# Patient Record
Sex: Male | Born: 1985 | Race: Black or African American | Hispanic: No | Marital: Married | Smoking: Never smoker
Health system: Southern US, Community
[De-identification: ages and names within clinical notes are randomized; demographics above are authoritative.]

---
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
## 2007-12-16 HISTORY — PX: ANTERIOR CRUCIATE LIGAMENT REPAIR: SHX115

## 2008-05-24 ENCOUNTER — Ambulatory Visit (HOSPITAL_BASED_OUTPATIENT_CLINIC_OR_DEPARTMENT_OTHER): Admission: RE | Admit: 2008-05-24 | Discharge: 2008-05-24 | Payer: Self-pay | Admitting: Orthopedic Surgery

## 2008-12-15 HISTORY — PX: ANTERIOR CRUCIATE LIGAMENT REPAIR: SHX115

## 2008-12-15 HISTORY — PX: ACHILLES TENDON REPAIR: SUR1153

## 2009-01-06 ENCOUNTER — Emergency Department (HOSPITAL_COMMUNITY): Admission: EM | Admit: 2009-01-06 | Discharge: 2009-01-06 | Payer: Self-pay | Admitting: Emergency Medicine

## 2011-01-05 ENCOUNTER — Encounter: Payer: Self-pay | Admitting: Family Medicine

## 2011-01-13 ENCOUNTER — Ambulatory Visit
Admission: RE | Admit: 2011-01-13 | Discharge: 2011-01-13 | Payer: Self-pay | Source: Home / Self Care | Attending: Family Medicine | Admitting: Family Medicine

## 2011-01-13 DIAGNOSIS — M6740 Ganglion, unspecified site: Secondary | ICD-10-CM | POA: Insufficient documentation

## 2011-01-20 ENCOUNTER — Encounter: Payer: Self-pay | Admitting: Family Medicine

## 2011-01-20 ENCOUNTER — Encounter (INDEPENDENT_AMBULATORY_CARE_PROVIDER_SITE_OTHER): Payer: Self-pay | Admitting: *Deleted

## 2011-01-20 ENCOUNTER — Other Ambulatory Visit (INDEPENDENT_AMBULATORY_CARE_PROVIDER_SITE_OTHER): Payer: 59

## 2011-01-20 ENCOUNTER — Other Ambulatory Visit: Payer: Self-pay | Admitting: Family Medicine

## 2011-01-20 DIAGNOSIS — Z0000 Encounter for general adult medical examination without abnormal findings: Secondary | ICD-10-CM

## 2011-01-20 DIAGNOSIS — R740 Nonspecific elevation of levels of transaminase and lactic acid dehydrogenase [LDH]: Secondary | ICD-10-CM

## 2011-01-20 DIAGNOSIS — Z113 Encounter for screening for infections with a predominantly sexual mode of transmission: Secondary | ICD-10-CM

## 2011-01-20 DIAGNOSIS — R7401 Elevation of levels of liver transaminase levels: Secondary | ICD-10-CM | POA: Insufficient documentation

## 2011-01-20 LAB — BASIC METABOLIC PANEL
BUN: 10 mg/dL (ref 6–23)
Calcium: 9.3 mg/dL (ref 8.4–10.5)
GFR: 93.62 mL/min (ref >60.00)
Glucose, Bld: 81 mg/dL (ref 70–99)
Potassium: 4.9 mEq/L (ref 3.5–5.1)
Sodium: 141 mEq/L (ref 135–145)

## 2011-01-20 LAB — HEPATIC FUNCTION PANEL
ALT: 99 U/L — ABNORMAL HIGH (ref 0–53)
AST: 73 U/L — ABNORMAL HIGH (ref 0–37)
Albumin: 3.7 g/dL (ref 3.5–5.2)
Alkaline Phosphatase: 56 U/L (ref 39–117)
Bilirubin, Direct: 0.2 mg/dL (ref 0.0–0.3)
Total Protein: 6.9 g/dL (ref 6.0–8.3)

## 2011-01-21 LAB — CONVERTED CEMR LAB

## 2011-01-22 NOTE — Assessment & Plan Note (Signed)
Summary: NEW PT TO EST/CPX/CLE   Vital Signs:  Patient profile:   25 year old male Height:      74 inches Weight:      218.75 pounds BMI:     28.19 Temp:     98.5 degrees F oral Pulse rate:   68 / minute Pulse rhythm:   regular BP sitting:   132 / 82  (left arm) Cuff size:   large  Vitals Entered By: Selena Batten Dance CMA Duncan Dull) (January 13, 2011 10:28 AM) CC: New patient to establish care   History of Present Illness: CC: new patient, establish  had CPE 2009 ([place]), thinks all normal.  had HIV test done then.  describes ganglion cyst that comes intermittently after sprained wrist 2010.  asks what can be done about that.    Prevention: flu shot - never had tetanus shot - 2006 done (thinks at [facility])  Current Medications (verified): 1)  Creatine 750 Mg Caps (Nutritional Supplements) .... One Daily 2)  Multivitamins  Tabs (Multiple Vitamin) .... One Daily  Allergies (verified): No Known Drug Allergies  Past History:  Past Medical History: none  ortho - [facility]  Past Surgical History: L ACL 2009, 2011 ([place]) R Achilles 2010 ([place])  Family History: F: HTN M: BRCA Uncle: colon CA PGF: intestinal CA, CAD/MI MGM: CVA  No DM  Social History: No smoking, social EtOH, no rec drugs caffeine: rarely Lives with cousin (roommate) Occupation: Merchandiser, retail at [employer] Edu: Hotel manager at [employer]  Review of Systems  The patient denies anorexia, fever, weight loss, weight gain, vision loss, decreased hearing, hoarseness, chest pain, syncope, dyspnea on exertion, peripheral edema, prolonged cough, headaches, hemoptysis, abdominal pain, melena, hematochezia, severe indigestion/heartburn, hematuria, incontinence, depression, and testicular masses.    Physical Exam  General:  Well-developed,well-nourished,in no acute distress; alert,appropriate and cooperative throughout examination Head:  Normocephalic and atraumatic without obvious abnormalities.  Eyes:   No corneal or conjunctival inflammation noted. EOMI. Perrla.  Ears:  TMs clear bilaterally Nose:  nares clear bilaterally Mouth:  MMM, no pharyngeal erythema/edema Neck:  No deformities, masses, or tenderness noted.  no LAD Lungs:  Normal respiratory effort, chest expands symmetrically. Lungs are clear to auscultation, no crackles or wheezes. Heart:  Normal rate and regular rhythm. S1 and S2 normal without gallop, murmur, click, rub or other extra sounds. Abdomen:  Bowel sounds positive,abdomen soft and non-tender without masses, organomegaly or hernias noted. Msk:  No deformity or scoliosis noted of thoracic or lumbar spine.   Pulses:  2+ rad pulses, brisk cap refill Extremities:  no pedal edema  Neurologic:  CN grossly intact, station and gait intact Skin:  Intact without suspicious lesions or rashes Psych:  full affect, pleasant and cooperative   Impression & Recommendations:  Problem # 1:  HEALTH MAINTENANCE EXAM (ICD-V70.0) Reviewed preventive care protocols, scheduled due services, and updated immunizations.  healthy 25 yo, return as needed or in 1-2 years for next CPE.  Problem # 2:  SCREENING EXAMINATION FOR VENEREAL DISEASE (ICD-V74.5) HIV/RPR next blood draw per patient request.  Problem # 3:  GANGLION CYST, WRIST, LEFT (ICD-727.41) none currently.  discussed different treatment options.  advised to return if bothersome.  Complete Medication List: 1)  Creatine 750 Mg Caps (Nutritional supplements) .... One daily 2)  Multivitamins Tabs (Multiple vitamin) .... One daily  Patient Instructions: 1)  Good to meet you today. 2)  return in next few weeks for fasting bloodwork [CMP, FLP, HIV, RPR V70.0, V74.5] 3)  Call clinic with questions. 4)  Come back in 1-2 years for next complete physical or as needed.   Orders Added: 1)  New Patient 18-39 years [[id number]]    Prior Medications: Current Allergies (reviewed today): No known allergies

## 2011-01-30 ENCOUNTER — Telehealth: Payer: Self-pay | Admitting: Family Medicine

## 2011-01-30 LAB — CONVERTED CEMR LAB

## 2011-02-05 NOTE — Progress Notes (Signed)
Summary: blood work thrown away  Phone Note From Other Clinic   Call For: Dr Sharen Hones Summary of Call: Sample was tossed before they pulled it for the Hepatitis Panel, Patient will need to be redrawn if you still want this test.  Initial call taken by: Mills Koller,  January 30, 2011 4:27 PM  Follow-up for Phone Call        please call patient and apologize for taking so long to get back to him - was awaiting viral hepatitis panel which turns out was thrown away accidentally by lab.  give other labwork results, and offer to redraw (no charge venipuncture) hepatitis panel. Follow-up by: Eustaquio Boyden  MD,  January 30, 2011 4:36 PM  Additional Follow-up for Phone Call Additional follow up Details #1::        Message left for patient to return my call. Kim Dance CMA Duncan Dull)  January 31, 2011 8:40 AM   Patient notified and will return for hep. panel. Appt scheduled to coincide with repeat LFT's in March.  Additional Follow-up by: Janee Morn CMA Duncan Dull),  January 31, 2011 12:45 PM

## 2011-02-24 ENCOUNTER — Other Ambulatory Visit: Payer: Self-pay | Admitting: Family Medicine

## 2011-02-24 ENCOUNTER — Encounter: Payer: Self-pay | Admitting: Family Medicine

## 2011-02-24 ENCOUNTER — Encounter: Payer: Self-pay | Admitting: *Deleted

## 2011-02-24 ENCOUNTER — Other Ambulatory Visit (INDEPENDENT_AMBULATORY_CARE_PROVIDER_SITE_OTHER): Payer: 59

## 2011-02-24 DIAGNOSIS — R7401 Elevation of levels of liver transaminase levels: Secondary | ICD-10-CM

## 2011-02-24 DIAGNOSIS — R7402 Elevation of levels of lactic acid dehydrogenase (LDH): Secondary | ICD-10-CM

## 2011-02-24 DIAGNOSIS — R740 Nonspecific elevation of levels of transaminase and lactic acid dehydrogenase [LDH]: Secondary | ICD-10-CM

## 2011-02-24 LAB — HEPATIC FUNCTION PANEL
ALT: 134 U/L — ABNORMAL HIGH (ref 0–53)
AST: 76 U/L — ABNORMAL HIGH (ref 0–37)
Albumin: 3.9 g/dL (ref 3.5–5.2)
Alkaline Phosphatase: 47 U/L (ref 39–117)
Total Protein: 6.7 g/dL (ref 6.0–8.3)

## 2011-02-27 ENCOUNTER — Encounter: Payer: Self-pay | Admitting: Family Medicine

## 2011-04-29 NOTE — Op Note (Signed)
NAMEJEZIAH, KRETSCHMER NO.:  [id number]   MEDICAL RECORD NO.:  [id number]          PATIENT TYPE:  AMB   LOCATION:  DSC                          FACILITY:  [facility]   PHYSICIAN:  Mila Homer. Sherlean Foot, M.D. DATE OF BIRTH:  08/04/1986   DATE OF PROCEDURE:  05/24/2008  DATE OF DISCHARGE:                               OPERATIVE REPORT   SURGEON:  Mila Homer. Sherlean Foot, MD.   Jani FilesSkip Mayer, PA.   ANESTHESIA:  General.   PREOPERATIVE DIAGNOSES:  Left knee anterior cruciate ligament tear and  medial meniscus tear.   POSTOPERATIVE DIAGNOSES:  1. Left knee anterior cruciate ligament tear and medial meniscus tear.  2. Osteoarthritis.   INDICATIONS FOR PROCEDURE:  The patient is 25 year old white male who is  6 years status post ACL tear and now with medial meniscus tearing and  mechanical symptoms.  Informed consent was obtained for ACL allograft.   DESCRIPTION OF PROCEDURE:  The patient was laid supine, administered  general anesthesia.  Left leg prepped and draped in usual sterile  fashion.  Inferolateral and femoral portals were created with a #11  blade, blunt trocar, and cannula.  Diagnostic arthroscopy revealed no  chondromalacia in the patellofemoral joint, some in the medial  compartment, and actually grade 2 and grade 3 in the lateral femoral  condyle.  The medial meniscus had a large tear.  It was not repairable,  but did involve about 75-80% of the entirety of the meniscus and this  was debrided.  Then, the ACL was actually not even present.  In any way,  we performed an aggressive notchplasty with a 4-mm cylindrical bur.  The  lateral part was basically normal except for the osteoarthritis.  The  meniscus had a few small degenerative tears which were debrided as well.  I then completed the notchplasty.  We had prepared the allograft on the  back table.  We used Arthrex system to drill 10-mm bone plugs in the  tibia and femur and placed 7 x 25-mm titanium  interference screws  anterior to each.  This completely eliminated the joint walk and there  was no impingement.  The graft was nice and tight.  Full range of  motion, then irrigated the knee, and closed with some buried 2-0s in the  tibial tunnel site, then 4-0 interrupted nylons throughout.   COMPLICATIONS:  None.   DRAINS:  None.   DRESSING:  Xeroform dressing, sponges, sterile Webril, and Ace wrap.          ______________________________  Mila Homer. Sherlean Foot, M.D.    SDL/MEDQ  D:  05/24/2008  T:  05/24/2008  Job:  [id number]

## 2011-05-26 ENCOUNTER — Other Ambulatory Visit (INDEPENDENT_AMBULATORY_CARE_PROVIDER_SITE_OTHER): Payer: 59 | Admitting: Family Medicine

## 2011-05-26 DIAGNOSIS — R7401 Elevation of levels of liver transaminase levels: Secondary | ICD-10-CM

## 2011-05-26 LAB — HEPATIC FUNCTION PANEL
ALT: 102 U/L — ABNORMAL HIGH (ref 0–53)
Alkaline Phosphatase: 51 U/L (ref 39–117)
Bilirubin, Direct: 0.1 mg/dL (ref 0.0–0.3)
Total Bilirubin: 0.7 mg/dL (ref 0.3–1.2)
Total Protein: 7.4 g/dL (ref 6.0–8.3)

## 2011-05-26 LAB — IBC PANEL: Transferrin: 267.6 mg/dL (ref 212.0–360.0)

## 2011-05-27 ENCOUNTER — Telehealth: Payer: Self-pay | Admitting: Family Medicine

## 2011-05-27 DIAGNOSIS — R7401 Elevation of levels of liver transaminase levels: Secondary | ICD-10-CM

## 2011-05-27 NOTE — Telephone Encounter (Signed)
Please notify liver function remaining somewhat elevated.   If still taking creatine may recommend trial off this to see if liver function normalizes. Ask if he's taking any other over the counters, vitamins or supplements, ask about tylenol use, alcohol use. Ask if pt was fasting by chance when he came in yesterday for blood draw.  If he was fasting yesterday, I'd like to add FLP to blood in lab.  If not, would like him to come in at his convenience fasting for FLP, no charge to him as this was ordered 01/2011 and not done.  Please let Jorge Simpson or Jorge Simpson know as well. I would like to set him up with liver ultrasound to further eval.  Placed order in chart and routed to [facility] as well.

## 2011-05-27 NOTE — Telephone Encounter (Signed)
Message left for patient to return my call.  

## 2011-05-30 NOTE — Telephone Encounter (Signed)
ABD/US set up for 06/03/2011 at [facility]  At 8am, patient notified.

## 2011-05-30 NOTE — Telephone Encounter (Signed)
Spoke with patient and scheduled lab appt on Monday 06/02/11

## 2011-05-30 NOTE — Telephone Encounter (Addendum)
I would like to set patient up for FLP at his convenience, fasting. No charge for drawing fee as this was ordered 01/2011 and not done. plz let terry or tasha know. Thanks.

## 2011-05-30 NOTE — Telephone Encounter (Signed)
Spoke with patient about results, he has stopped taking the creatine, he does takes vitamins "vita-pak" and does drink alcohol, he had about 1-2 mixed drinks around the time labwork was done, pt was fasting when labs were done. Transferred call to [facility] to set up ultrasound appt.

## 2011-06-02 ENCOUNTER — Other Ambulatory Visit (INDEPENDENT_AMBULATORY_CARE_PROVIDER_SITE_OTHER): Payer: 59 | Admitting: Family Medicine

## 2011-06-02 DIAGNOSIS — R7401 Elevation of levels of liver transaminase levels: Secondary | ICD-10-CM

## 2011-06-02 LAB — LIPID PANEL
Cholesterol: 274 mg/dL — ABNORMAL HIGH (ref 0–200)
HDL: 35 mg/dL — ABNORMAL LOW (ref >39.00)
Total CHOL/HDL Ratio: 8
Triglycerides: 116 mg/dL (ref 0.0–149.0)
VLDL: 23.2 mg/dL (ref 0.0–40.0)

## 2011-06-03 ENCOUNTER — Ambulatory Visit
Admission: RE | Admit: 2011-06-03 | Discharge: 2011-06-03 | Disposition: A | Discharge status: Home / Self Care | Payer: 59 | Source: Ambulatory Visit | Attending: Family Medicine | Admitting: Family Medicine

## 2011-06-03 DIAGNOSIS — R7401 Elevation of levels of liver transaminase levels: Secondary | ICD-10-CM

## 2012-10-07 ENCOUNTER — Ambulatory Visit (INDEPENDENT_AMBULATORY_CARE_PROVIDER_SITE_OTHER): Payer: 59 | Admitting: Emergency Medicine

## 2012-10-07 VITALS — BP 144/84 | HR 61 | Temp 97.8°F | Resp 17 | Ht 72.0 in | Wt 202.0 lb

## 2012-10-07 DIAGNOSIS — H9190 Unspecified hearing loss, unspecified ear: Secondary | ICD-10-CM

## 2012-10-07 NOTE — Progress Notes (Signed)
[facility] [address] [place] [address] [phone]  Date:  10/07/2012   Name:  Jorge Simpson   DOB:  [birth year]   MRN:  [id number]  PCP:  Eustaquio Boyden, MD    Chief Complaint: Cerumen Impaction   History of Present Illness:  [name] is a 26 y.o. very pleasant male patient who presents with the following:  Concerned that he has cerumen impaction in his ears as his hearing is diminished left worse than right.  Denies cough or coryza, fever or chills, sore throat or foreign body in the ears.  No exposure to loud noises  Patient Active Problem List  Diagnosis  . GANGLION CYST, WRIST, LEFT  . TRANSAMINASES, SERUM, ELEVATED    No past medical history on file.  Past Surgical History  Procedure Date  . Joint replacement     History  Substance Use Topics  . Smoking status: Never Smoker   . Smokeless tobacco: Not on file  . Alcohol Use: Not on file    No family history on file.  No Known Allergies  Medication list has been reviewed and updated.  No current outpatient prescriptions on file prior to visit.    Review of Systems:  As per HPI, otherwise negative.    Physical Examination: Filed Vitals:   10/07/12 1155  BP: 144/84  Pulse: 61  Temp: 97.8 F (36.6 C)  Resp: 17   Filed Vitals:   10/07/12 1155  Height: 6' (1.829 m)  Weight: 202 lb (91.627 kg)   Body mass index is 27.40 kg/(m^2). Ideal Body Weight: Weight in (lb) to have BMI = 25: 183.9   GEN: WDWN, NAD, Non-toxic, A & O x 3 HEENT: Atraumatic, Normocephalic. Neck supple. No masses, No LAD. Ears and Nose: No external deformity.  TM negative external ear canal clear CV: RRR, No M/G/R. No JVD. No thrill. No extra heart sounds. PULM: CTA B, no wheezes, crackles, rhonchi. No retractions. No resp. distress. No accessory muscle use. ABD: S, NT, ND, +BS. No rebound. No HSM. EXTR: No c/c/e NEURO Normal gait.  PSYCH: Normally interactive. Conversant. Not depressed  or anxious appearing.  Calm demeanor.    Assessment and Plan: Mild high frequency hearing loss left ear. Follow up as needed  Carmelina Dane, MD  I have reviewed and agree with documentation. Robert P. Merla Riches, M.D.

## 2012-10-11 ENCOUNTER — Ambulatory Visit (INDEPENDENT_AMBULATORY_CARE_PROVIDER_SITE_OTHER): Payer: 59 | Admitting: Family Medicine

## 2012-10-11 ENCOUNTER — Encounter: Payer: Self-pay | Admitting: Family Medicine

## 2012-10-11 VITALS — BP 118/78 | HR 64 | Temp 98.0°F | Wt 206.8 lb

## 2012-10-11 DIAGNOSIS — H6590 Unspecified nonsuppurative otitis media, unspecified ear: Secondary | ICD-10-CM

## 2012-10-11 DIAGNOSIS — H6592 Unspecified nonsuppurative otitis media, left ear: Secondary | ICD-10-CM

## 2012-10-11 MED ORDER — AMOXICILLIN 875 MG PO TABS: 875.0000 mg | ORAL_TABLET | Freq: Two times a day (BID) | ORAL | Status: DC

## 2012-10-11 NOTE — Progress Notes (Signed)
  Subjective:    Patient ID: Jorge Simpson, male    DOB: 06-03-1986, 26 y.o.   MRN: [id number]  HPI CC: f/u [facility] eval for hearing loss  Seen at [facility] 10/07/2012 with concern for hearing loss - evaluation negative for cerumen impaction.  Noted L>R hearing loss and pressure that started Thursday morning (5d ago).  Used qtip on Wednesday.  Denies recent viral sxs, denies recent exposure to loud noises.  No recent tinnitus, drainage from ear, recent ear infection, fevers/chills, swimming.  Supervisor at [employer].  No recent air travel.  No recent diving.  No past medical history on file.   Review of Systems Per HPI    Objective:   Physical Exam  Nursing note and vitals reviewed. Constitutional: He appears well-developed and well-nourished. No distress.  HENT:  Head: Normocephalic and atraumatic.  Right Ear: Hearing, tympanic membrane, external ear and ear canal normal.  Left Ear: Hearing, external ear and ear canal normal. No mastoid tenderness. Tympanic membrane is erythematous and retracted. Tympanic membrane mobility is abnormal. A middle ear effusion is present. No hemotympanum.  Nose: Nose normal. No mucosal edema or rhinorrhea.  Mouth/Throat: Uvula is midline, oropharynx is clear and moist and mucous membranes are normal. No oropharyngeal exudate, posterior oropharyngeal edema, posterior oropharyngeal erythema or tonsillar abscesses.       L TM with loss of landmarks and light reflex. Green fluid accumulation behind TM. nontender to palpation.  Eyes: Conjunctivae normal and EOM are normal. Pupils are equal, round, and reactive to light. No scleral icterus.  Neck: Normal range of motion. Neck supple.  Lymphadenopathy:    He has no cervical adenopathy.  Neurological:       Normal rhinne and weber       Assessment & Plan:

## 2012-10-11 NOTE — Assessment & Plan Note (Addendum)
Treat supportively as per instructions. Will also treat with course of amoxicillin to treat any possible ear infection. Given complete loss of anatomy, unable to fully r/o perf.  rtc 3-4 wks for recheck.

## 2012-10-11 NOTE — Patient Instructions (Addendum)
You do have middle ear effusion. We will treat with antibiotic to treat any possible simmering ear infection. May use nasal saline as well as pseudophed (Sudafed) as needed as decongestant for symptomatic relief (you will likely have to sign for this at the pharmacy). Hearing screen today. Return in 3-4 weeks for recheck.  If persistent trouble, we will refer you to ENT.  Otitis Media with Effusion Otitis media with effusion is the presence of fluid in the middle ear. This is a common problem that often follows ear infections. It may be present for weeks or longer after the infection. Unlike an acute ear infection, otits media with effusion refers only to fluid behind the ear drum and not infection. Children with repeated ear and sinus infections and allergy problems are the most likely to get otitis media with effusion. CAUSES  The most frequent cause of the fluid buildup is dysfunction of the eustacian tubes. These are the tubes that drain fluid in the ears to the throat. SYMPTOMS   The main symptom of this condition is hearing loss. As a result, you or your child may:  Listen to the TV at a loud volume.  Not respond to questions.  Ask "what" often when spoken to.  There may be a sensation of fullness or pressure but usually not pain. DIAGNOSIS   Your caregiver will diagnose this condition by examining you or your child's ears.  Your caregiver may test the pressure in you or your child's ear with a tympanometer.  A hearing test may be conducted if the problem persists.  A caregiver will want to re-evaluate the condition periodically to see if it improves. TREATMENT   Treatment depends on the duration and the effects of the effusion.  Antibiotics, decongestants, nose drops, and cortisone-type drugs may not be helpful.  Children with persistent ear effusions may have delayed language. Children at risk for developmental delays in hearing, learning, and speech may require referral  to a specialist earlier than children not at risk.  You or your child's caregiver may suggest a referral to an Ear, Nose, and Throat (ENT) surgeon for treatment. The following may help restore normal hearing:  Drainage of fluid.  Placement of ear tubes (tympanostomy tubes).  Removal of adenoids (adenoidectomy). HOME CARE INSTRUCTIONS   Avoid second hand smoke.  Infants who are breast fed are less likely to have this condition.  Avoid feeding infants while laying flat.  Avoid known environmental allergens.  Be sure to see a caregiver or an ENT specialist for follow up.  Avoid people who are sick. SEEK MEDICAL CARE IF:   Hearing is not better in 3 months.  Hearing is worse.  Ear pain.  Drainage from the ear.  Dizziness. Document Released: 01/08/2005 Document Revised: 02/23/2012 Document Reviewed: 04/23/2010 [facility] Patient Information 2013 [place], [place].

## 2012-11-01 ENCOUNTER — Encounter: Payer: Self-pay | Admitting: Family Medicine

## 2012-11-01 ENCOUNTER — Ambulatory Visit (INDEPENDENT_AMBULATORY_CARE_PROVIDER_SITE_OTHER): Payer: 59 | Admitting: Family Medicine

## 2012-11-01 VITALS — BP 130/84 | HR 64 | Temp 98.0°F | Wt 208.2 lb

## 2012-11-01 DIAGNOSIS — H6590 Unspecified nonsuppurative otitis media, unspecified ear: Secondary | ICD-10-CM

## 2012-11-01 DIAGNOSIS — H6592 Unspecified nonsuppurative otitis media, left ear: Secondary | ICD-10-CM

## 2012-11-01 NOTE — Progress Notes (Signed)
  Subjective:    Patient ID: Jorge Simpson, male    DOB: Sep 20, 1986, 26 y.o.   MRN: [id number]  HPI CC: f/u hearing loss  Seen here last month with dx serous otitis media and L hearing loss, treated with amoxicillin course as well as other supportive care.  Overall improving, still slightly muffled.  Denies fevers/chills, ear drainage.  No past medical history on file.   Review of Systems Per HPI    Objective:   Physical Exam  Nursing note and vitals reviewed. Constitutional: He appears well-developed and well-nourished. No distress.  HENT:  Head: Normocephalic and atraumatic.  Right Ear: Hearing, tympanic membrane, external ear and ear canal normal.  Left Ear: Hearing, tympanic membrane, external ear and ear canal normal.  Nose: Nose normal.  Mouth/Throat: Oropharynx is clear and moist. No oropharyngeal exudate.       Landmarks visualized and back to normal bilaterally. L TM - pearly grey, good light reflex.  Eyes: Conjunctivae normal and EOM are normal. Pupils are equal, round, and reactive to light.  Lymphadenopathy:    He has no cervical adenopathy.       Assessment & Plan:

## 2012-11-01 NOTE — Assessment & Plan Note (Signed)
Resolving well.  Improved hearing screen. Discussed anticipated resolution.  RTC PRN.

## 2012-11-01 NOTE — Patient Instructions (Addendum)
Ear getting back to normal. Should continue to resolve with time. Call us with questions .

## 2012-11-23 ENCOUNTER — Ambulatory Visit (INDEPENDENT_AMBULATORY_CARE_PROVIDER_SITE_OTHER): Payer: 59 | Admitting: Family Medicine

## 2012-11-23 ENCOUNTER — Encounter: Payer: Self-pay | Admitting: Family Medicine

## 2012-11-23 VITALS — BP 112/80 | HR 88 | Temp 99.5°F | Wt 199.2 lb

## 2012-11-23 DIAGNOSIS — A088 Other specified intestinal infections: Secondary | ICD-10-CM

## 2012-11-23 DIAGNOSIS — A084 Viral intestinal infection, unspecified: Secondary | ICD-10-CM | POA: Insufficient documentation

## 2012-11-23 MED ORDER — PROMETHAZINE HCL 25 MG PO TABS: 25.0000 mg | ORAL_TABLET | Freq: Three times a day (TID) | ORAL | Status: DC | PRN

## 2012-11-23 NOTE — Assessment & Plan Note (Signed)
Vs food poisoning. Supportive care discussed.  See pt instructions. Treat nausea with phenergan. Discussed importance of hydration status. Out of work note for 2 days.

## 2012-11-23 NOTE — Patient Instructions (Signed)
This is either viral gastroenteritis or food poisoning. Push small sips of water, gatorade, ginger ale throughout the day to avoid dehydration. May use phenergan as needed for nausea - may make you sleepy. Out of work today and tomorrow.  Should be able to return on Thursday. Let us know if fever >101 or worsening abdominal pain or any bleeding.  Viral Gastroenteritis Viral gastroenteritis is also known as stomach flu. This condition affects the stomach and intestinal tract. It can cause sudden diarrhea and vomiting. The illness typically lasts 3 to 8 days. Most people develop an immune response that eventually gets rid of the virus. While this natural response develops, the virus can make you quite ill. CAUSES  Many different viruses can cause gastroenteritis, such as rotavirus or noroviruses. You can catch one of these viruses by consuming contaminated food or water. You may also catch a virus by sharing utensils or other personal items with an infected person or by touching a contaminated surface. SYMPTOMS  The most common symptoms are diarrhea and vomiting. These problems can cause a severe loss of body fluids (dehydration) and a body salt (electrolyte) imbalance. Other symptoms may include:  Fever.  Headache.  Fatigue.  Abdominal pain. DIAGNOSIS  Your caregiver can usually diagnose viral gastroenteritis based on your symptoms and a physical exam. A stool sample may also be taken to test for the presence of viruses or other infections. TREATMENT  This illness typically goes away on its own. Treatments are aimed at rehydration. The most serious cases of viral gastroenteritis involve vomiting so severely that you are not able to keep fluids down. In these cases, fluids must be given through an intravenous line (IV). HOME CARE INSTRUCTIONS   Drink enough fluids to keep your urine clear or pale yellow. Drink small amounts of fluids frequently and increase the amounts as tolerated.  Ask  your caregiver for specific rehydration instructions.  Avoid:  Foods high in sugar.  Alcohol.  Carbonated drinks.  Tobacco.  Juice.  Caffeine drinks.  Extremely hot or cold fluids.  Fatty, greasy foods.  Too much intake of anything at one time.  Dairy products until 24 to 48 hours after diarrhea stops.  You may consume probiotics. Probiotics are active cultures of beneficial bacteria. They may lessen the amount and number of diarrheal stools in adults. Probiotics can be found in yogurt with active cultures and in supplements.  Wash your hands well to avoid spreading the virus.  Only take over-the-counter or prescription medicines for pain, discomfort, or fever as directed by your caregiver. Do not give aspirin to children. Antidiarrheal medicines are not recommended.  Ask your caregiver if you should continue to take your regular prescribed and over-the-counter medicines.  Keep all follow-up appointments as directed by your caregiver. SEEK IMMEDIATE MEDICAL CARE IF:   You are unable to keep fluids down.  You do not urinate at least once every 6 to 8 hours.  You develop shortness of breath.  You notice blood in your stool or vomit. This may look like coffee grounds.  You have abdominal pain that increases or is concentrated in one small area (localized).  You have persistent vomiting or diarrhea.  You have a fever.  The patient is a child younger than 3 months, and he or she has a fever.  The patient is a child older than 3 months, and he or she has a fever and persistent symptoms.  The patient is a child older than 3 months, and he  or she has a fever and symptoms suddenly get worse.  The patient is a baby, and he or she has no tears when crying. MAKE SURE YOU:   Understand these instructions.  Will watch your condition.  Will get help right away if you are not doing well or get worse. Document Released: 12/01/2005 Document Revised: 02/23/2012 Document  Reviewed: 09/17/2011 [facility] Patient Information 2013 [place], [place].

## 2012-11-23 NOTE — Progress Notes (Signed)
  Subjective:    Patient ID: Jorge Simpson, male    DOB: 29-Feb-1986, 26 y.o.   MRN: [id number]  HPI CC: emesis  Around 1am this morning awoke with abd pain/pressure, this led to nausea and vomiting - NBNB.   abd pain better but feeling very weak.  At 4am started with some diarrhea.  Trying to drink.  Last emesis was 8am.    No fevers/chills.   No recent travel.  Ate at [employer] last night, restaurant on sanitation list. No sick contacts at home.  Doesn't know how other ppl who ate are doing.  No past medical history on file.   Review of Systems Per HPI    Objective:   Physical Exam  Nursing note and vitals reviewed. Constitutional: He appears well-developed and well-nourished. No distress.  HENT:  Head: Normocephalic and atraumatic.  Mouth/Throat: Oropharynx is clear and moist. No oropharyngeal exudate.  Abdominal: Soft. Bowel sounds are normal. He exhibits no distension and no mass. There is no hepatosplenomegaly. There is no tenderness. There is no rebound, no guarding and no CVA tenderness.  Musculoskeletal: He exhibits no edema.       Assessment & Plan:

## 2012-12-23 IMAGING — US US ABDOMEN COMPLETE
1 series · 14 of 25 positions shown · non-contrast
Comparison: None.

CLINICAL DATA: Persistently elevated LFTs

COMPLETE ABDOMINAL ULTRASOUND

[Series 1: us abdomen complete · 0.28mm/px · 14 of 88 slices shown]
[im 1/88]
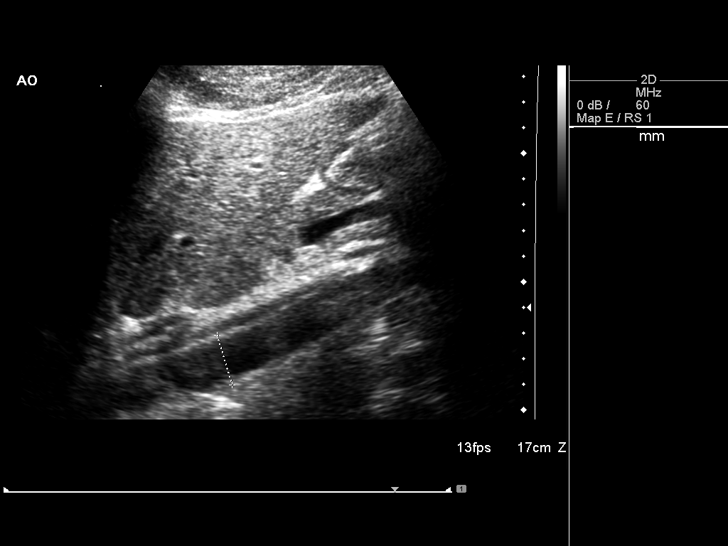
[im 8/88]
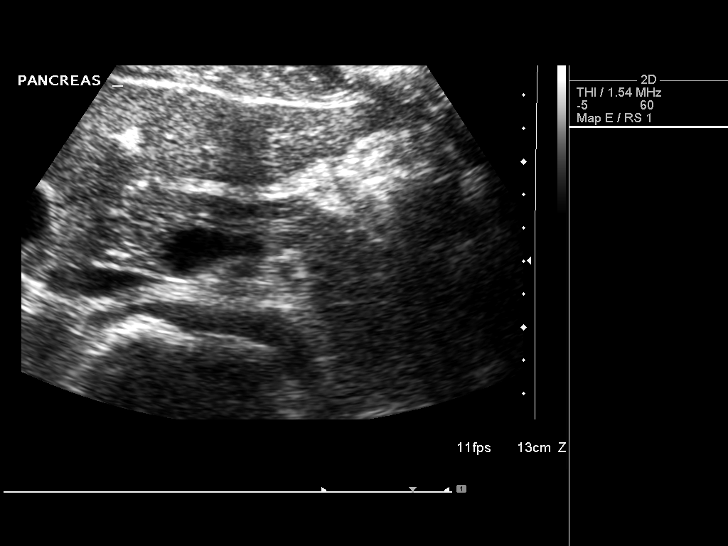
[im 15/88]
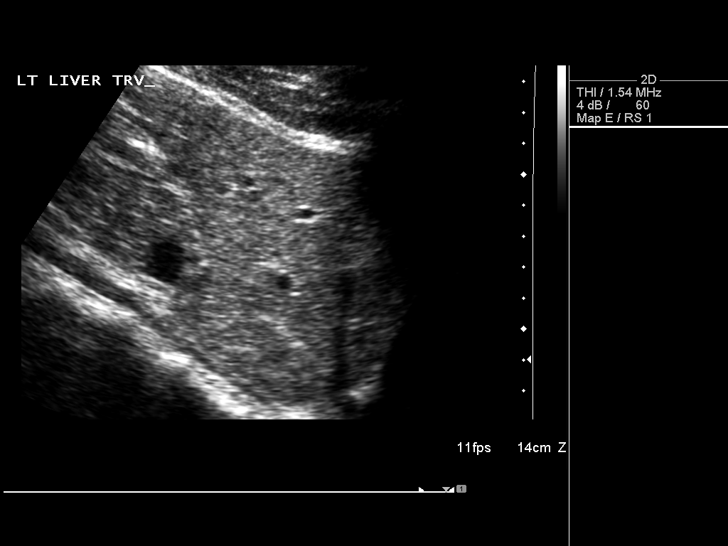
[im 22/88]
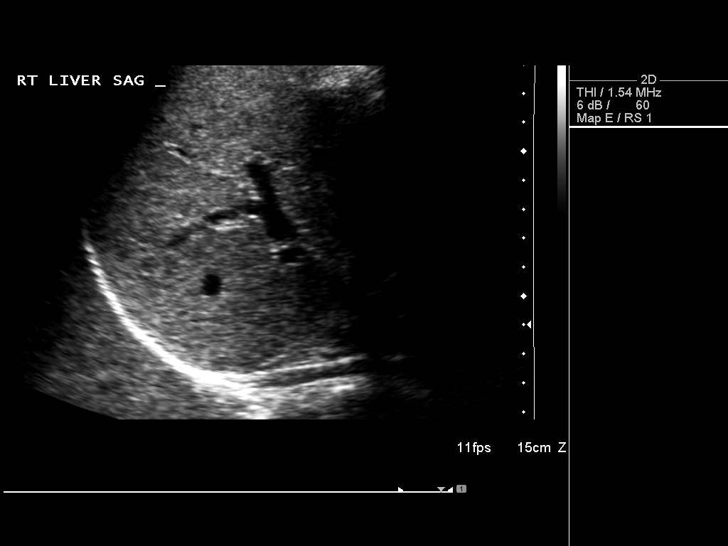
[im 30/88]
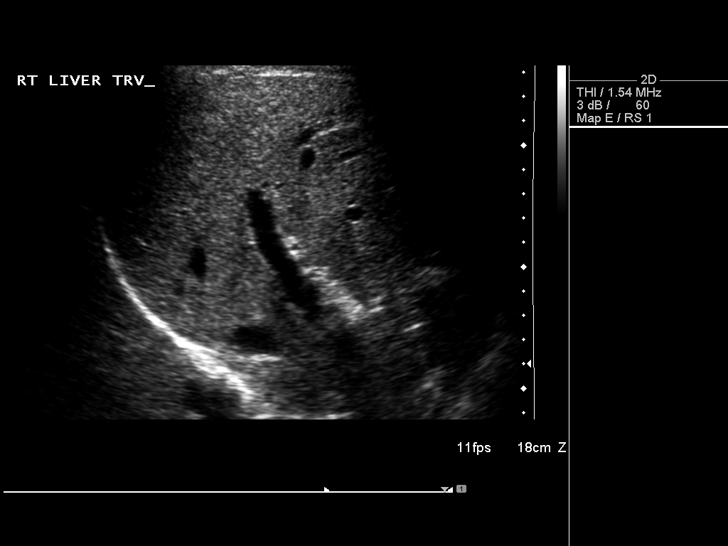
[im 33/88]
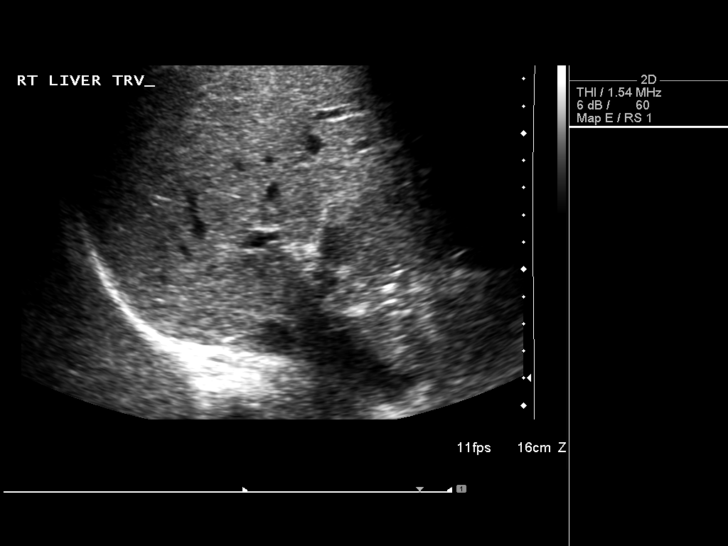
[im 40/88]
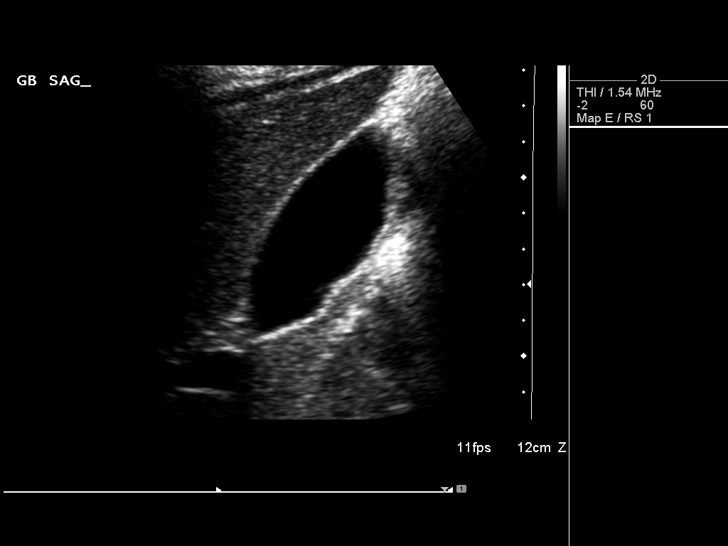
[im 48/88]
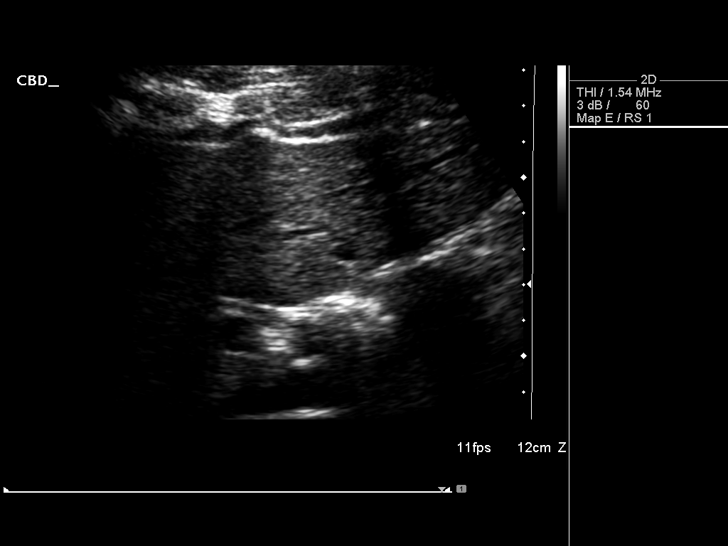
[im 55/88]
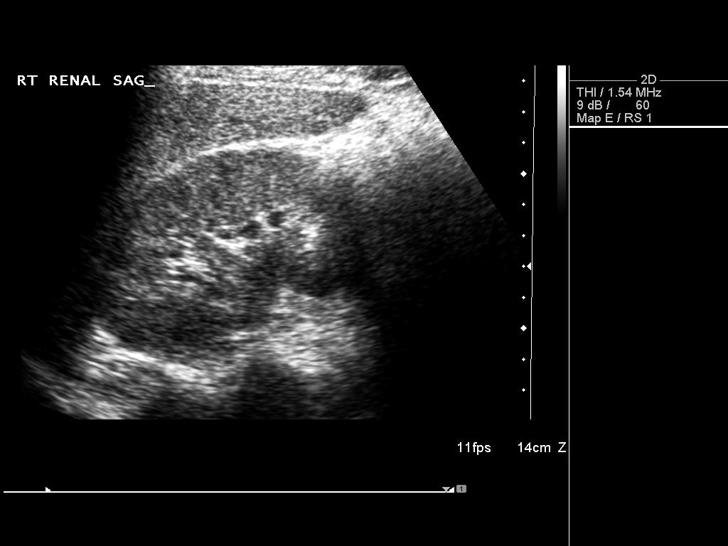
[im 59/88]
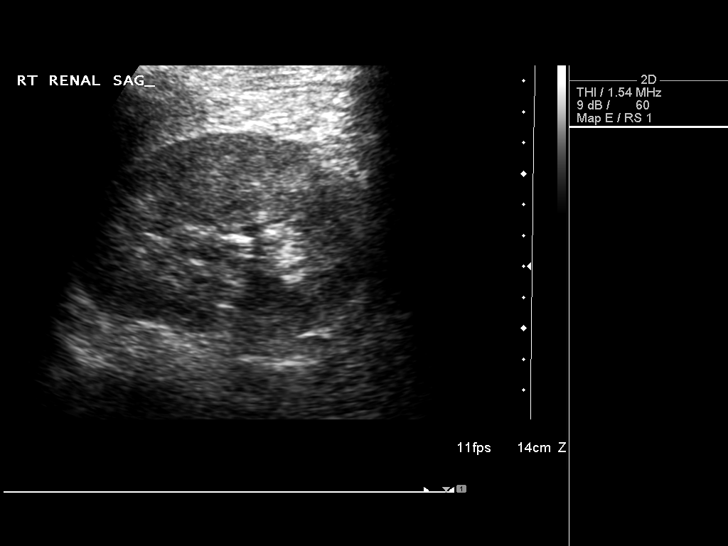
[im 66/88]
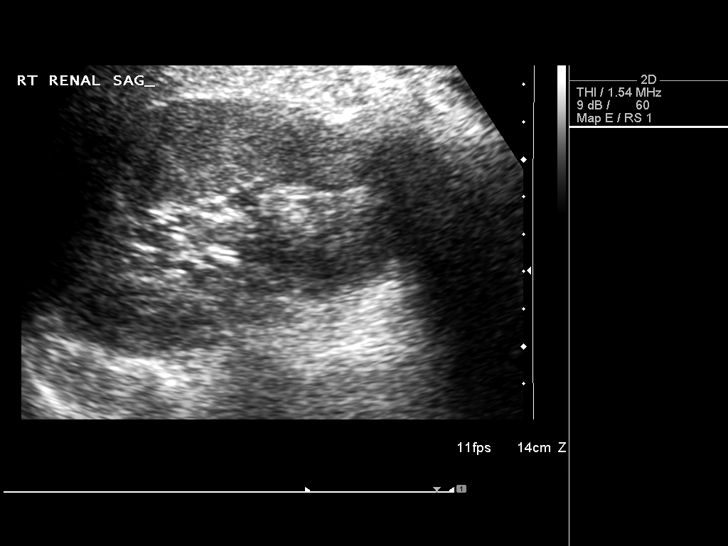
[im 73/88]
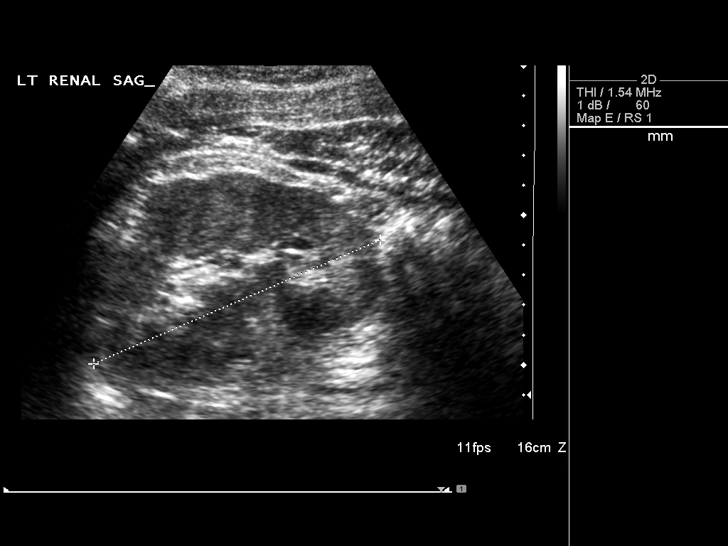
[im 80/88]
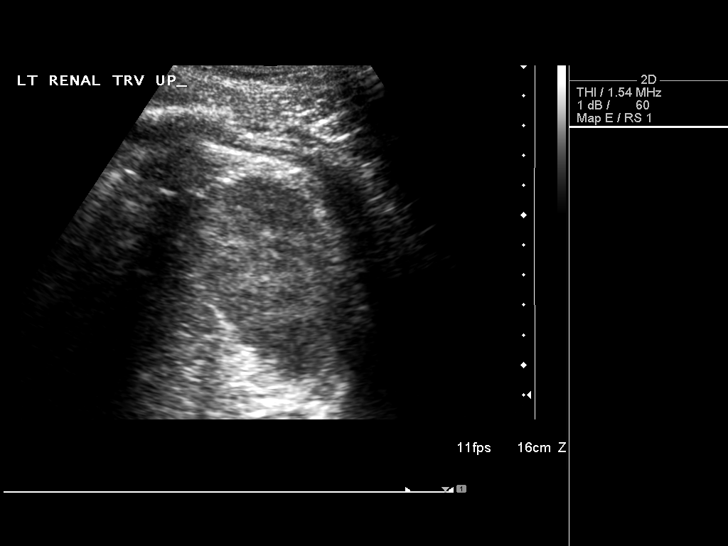
[im 88/88]
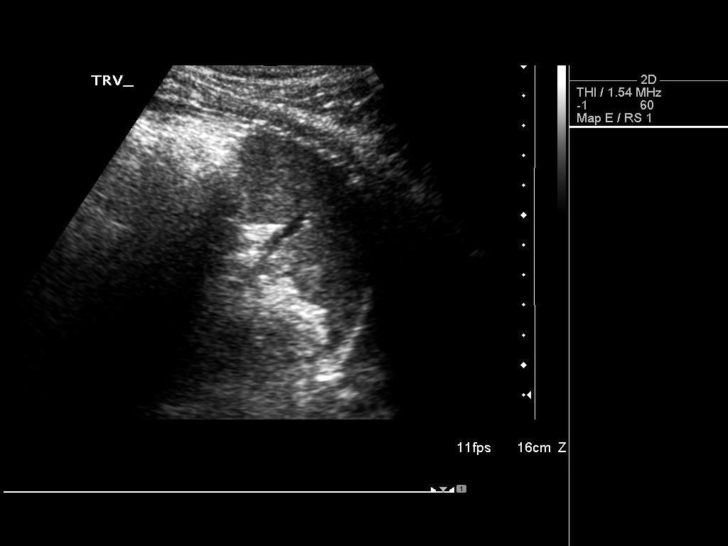

[14 of 25 positions shown; findings below may reference images not displayed]

FINDINGS: Gallbladder:  No gallstones, gallbladder wall thickening, or
pericholecystic fluid. Evaluation for a sonographic Murphy's sign
is negative

Common bile duct:  Has a diameter 3.4 mm and normal sonographic
appearance

Liver:  No focal lesion identified.  Within normal limits in
parenchymal echogenicity. No signs of intrahepatic ductal
dilatation are noted

IVC:  Appears normal.

Pancreas:  No focal abnormality seen.

Spleen:  Has a sagittal length of 4.6 cm with no focal parenchymal
abnormality noted

Right Kidney:  Measures 10.2 cm in length and shows no focal
parenchymal abnormality or signs of hydronephrosis

Left Kidney:  Measures 10.4 cm in length and shows no focal
parenchymal abnormality or signs of hydronephrosis

Abdominal aorta:  Has a maximal caliber of 2.0 cm with no evidence
for aneurysmal dilatation.
IMPRESSION: Negative abdominal ultrasound.

## 2014-11-06 ENCOUNTER — Encounter: Payer: Self-pay | Admitting: Family Medicine

## 2014-11-06 ENCOUNTER — Encounter: Payer: 59 | Admitting: Family Medicine

## 2014-11-06 ENCOUNTER — Ambulatory Visit (INDEPENDENT_AMBULATORY_CARE_PROVIDER_SITE_OTHER): Payer: 59 | Admitting: Family Medicine

## 2014-11-06 VITALS — BP 122/86 | HR 64 | Temp 97.6°F | Ht 74.0 in | Wt 212.8 lb

## 2014-11-06 DIAGNOSIS — R740 Nonspecific elevation of levels of transaminase and lactic acid dehydrogenase [LDH]: Secondary | ICD-10-CM

## 2014-11-06 DIAGNOSIS — R21 Rash and other nonspecific skin eruption: Secondary | ICD-10-CM | POA: Insufficient documentation

## 2014-11-06 DIAGNOSIS — Z113 Encounter for screening for infections with a predominantly sexual mode of transmission: Secondary | ICD-10-CM

## 2014-11-06 DIAGNOSIS — Z0001 Encounter for general adult medical examination with abnormal findings: Secondary | ICD-10-CM

## 2014-11-06 DIAGNOSIS — Z0000 Encounter for general adult medical examination without abnormal findings: Secondary | ICD-10-CM | POA: Insufficient documentation

## 2014-11-06 DIAGNOSIS — R7401 Elevation of levels of liver transaminase levels: Secondary | ICD-10-CM

## 2014-11-06 DIAGNOSIS — R7402 Elevation of levels of lactic acid dehydrogenase (LDH): Secondary | ICD-10-CM

## 2014-11-06 DIAGNOSIS — E785 Hyperlipidemia, unspecified: Secondary | ICD-10-CM | POA: Insufficient documentation

## 2014-11-06 LAB — COMPREHENSIVE METABOLIC PANEL
ALBUMIN: 4.5 g/dL (ref 3.5–5.2)
ALT: 32 U/L (ref 0–53)
AST: 40 U/L — ABNORMAL HIGH (ref 0–37)
Alkaline Phosphatase: 49 U/L (ref 39–117)
BILIRUBIN TOTAL: 0.9 mg/dL (ref 0.2–1.2)
BUN: 14 mg/dL (ref 6–23)
CO2: 27 meq/L (ref 19–32)
Calcium: 9.3 mg/dL (ref 8.4–10.5)
Chloride: 103 mEq/L (ref 96–112)
Creatinine, Ser: 1.4 mg/dL (ref 0.4–1.5)
GFR: 78.88 mL/min (ref >60.00)
Glucose, Bld: 87 mg/dL (ref 70–99)
POTASSIUM: 4.2 meq/L (ref 3.5–5.1)
SODIUM: 139 meq/L (ref 135–145)
Total Protein: 7.7 g/dL (ref 6.0–8.3)

## 2014-11-06 LAB — LIPID PANEL
Cholesterol: 185 mg/dL (ref 0–200)
HDL: 40.9 mg/dL (ref >39.00)
LDL CALC: 132 mg/dL — AB (ref 0–99)
NONHDL: 144.1
Total CHOL/HDL Ratio: 5
Triglycerides: 62 mg/dL (ref 0.0–149.0)
VLDL: 12.4 mg/dL (ref 0.0–40.0)

## 2014-11-06 LAB — TSH: TSH: 1.19 u[IU]/mL (ref 0.35–4.50)

## 2014-11-06 NOTE — Assessment & Plan Note (Signed)
Shoulder - tinea corporis Scalp - concerns for beginning of tinea capitis. 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 Avoid oral antifungal for now 2/2 transaminitis history. Recheck LFTs today. Will also write letter for work asking for pt to be able to avoid shaving for next several months.

## 2014-11-06 NOTE — Assessment & Plan Note (Signed)
LDL 220s last check. Recheck FLP today. Discussed low chol diet and handout provided today.

## 2014-11-06 NOTE — Progress Notes (Signed)
BP 122/86 mmHg  Pulse 64  Temp(Src) 97.6 F (36.4 C) (Oral)  Ht 6\' 2"  (1.88 m)  Wt 212 lb 12 oz (96.503 kg)  BMI 27.30 kg/m2   CC: CPE  Subjective:    Patient ID: Jorge Simpson, male    DOB: 09/24/1986, 28 y.o.   MRN: [id number]  HPI: Jorge Simpson is a 28 y.o. male presenting on 11/06/2014 for Annual Exam   Reviewed extensive fmhx cancers.  Would like several rashes evaluated. Noticed L anterior shoulder skin rash for last week, pruritic. Also with similar rash at base of scalp bilateral lateral edges. Also with bumps at border of beard below lower lip.  Preventative: No recent CPE Declines flu shot Tdap - today Seat belt use discussed Currently sexually active, 3 partners in past year. 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  Allergies and medications reviewed and updated. No current outpatient prescriptions on file prior to visit.   No current facility-administered medications on file prior to visit.    Review of Systems  Constitutional: Negative for fever, chills, activity change, appetite change, fatigue and unexpected weight change.  HENT: Negative for hearing loss.   Eyes: Negative for visual disturbance.  Respiratory: Negative for cough, chest tightness, shortness of breath and wheezing.   Cardiovascular: Negative for chest pain, palpitations and leg swelling.  Gastrointestinal: Negative for nausea, vomiting, abdominal pain, diarrhea, constipation, blood in stool and abdominal distention.  Genitourinary: Negative for hematuria and difficulty urinating.  Musculoskeletal: Negative for myalgias, arthralgias and neck pain.  Skin: Negative for rash.  Neurological: Negative for dizziness, seizures,  syncope and headaches.  Hematological: Negative for adenopathy. Does not bruise/bleed easily.  Psychiatric/Behavioral: Negative for dysphoric mood. The patient is not nervous/anxious.    Per HPI unless specifically indicated above    Objective:    BP 122/86 mmHg  Pulse 64  Temp(Src) 97.6 F (36.4 C) (Oral)  Ht 6\' 2"  (1.88 m)  Wt 212 lb 12 oz (96.503 kg)  BMI 27.30 kg/m2  Physical Exam  Constitutional: He is oriented to person, place, and time. He appears well-developed and well-nourished. No distress.  HENT:  Head: Normocephalic and atraumatic.  Right Ear: Hearing, tympanic membrane, external ear and ear canal normal.  Left Ear: Hearing, tympanic membrane, external ear and ear canal normal.  Nose: Nose normal.  Mouth/Throat: Uvula is midline, oropharynx is clear and moist and mucous membranes are normal. No oropharyngeal exudate, posterior oropharyngeal edema or posterior oropharyngeal erythema.  Eyes: Conjunctivae and EOM are normal. Pupils are equal, round, and reactive to light. No scleral icterus.  Neck: Normal range of motion. Neck supple. No thyromegaly present.  Cardiovascular: Normal rate, regular rhythm, normal heart sounds and intact distal pulses.   No murmur heard. Pulses:      Radial pulses are 2+ on the right side, and 2+ on the left side.  Pulmonary/Chest: Effort normal and breath sounds normal. No respiratory distress. He has no wheezes. He has no rales.  Abdominal: Soft. Bowel sounds are normal. He exhibits no distension and no mass. There is no tenderness. There is no rebound and no guarding.  Musculoskeletal: Normal range of motion. He exhibits no edema.  Lymphadenopathy:    He has no cervical adenopathy.  Neurological: He is alert and  oriented to person, place, and time.  CN grossly intact, station and gait intact  Skin: Skin is warm and dry. Rash noted.  Small 1cm plaque anterior L shoulder and occipital base of scalp L>R with border of erythema around  lesions, pruritic. nonerythematous bumps at border of beard below lower lip  Psychiatric: He has a normal mood and affect. His behavior is normal. Judgment and thought content normal.  Nursing note and vitals reviewed.      Assessment & Plan:   Problem List Items Addressed This Visit    TRANSAMINASES, SERUM, ELEVATED    Recheck levels today. S/p normal abd US, iron levels and acute viral hep panel    Relevant Orders      Comprehensive metabolic panel      TSH   Skin rash    Shoulder - tinea corporis Scalp - concerns for beginning of tinea capitis. 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 Avoid oral antifungal for now 2/2 transaminitis history. Recheck LFTs today. Will also write letter for work asking for pt to be able to avoid shaving for next several months.    HLD (hyperlipidemia)    LDL 220s last check. Recheck FLP today. Discussed low chol diet and handout provided today.    Health maintenance examination - Primary    Preventative protocols reviewed and updated unless pt declined. Discussed healthy diet and lifestyle.      Other Visit Diagnoses    Screen for STD (sexually transmitted disease)        Relevant Orders       RPR       HIV antibody    Hyperlipidemia        Relevant Orders       Comprehensive metabolic panel       Lipid panel       TSH        Follow up plan: Return in about 1 year (around 11/07/2015), or as needed, for annual exam, prior fasting for blood work.

## 2014-11-06 NOTE — Patient Instructions (Addendum)
Good to see you today, call us with questions. Blood work today. Work on low cholesterol diet. For skin rash - start clotrimazole cream (may buy OTC) twice daily to affected areas. If not better after 3-4 weeks let me know. Letter for work provided. Return as needed or in 1-2 years for next physical

## 2014-11-06 NOTE — Assessment & Plan Note (Signed)
Preventative protocols reviewed and updated unless pt declined. Discussed healthy diet and lifestyle.  

## 2014-11-06 NOTE — Assessment & Plan Note (Signed)
Recheck levels today. S/p normal abd US, iron levels and acute viral hep panel

## 2014-11-06 NOTE — Progress Notes (Signed)
Pre visit review using our clinic review tool, if applicable. No additional management support is needed unless otherwise documented below in the visit note. 

## 2014-11-07 ENCOUNTER — Encounter: Payer: Self-pay | Admitting: *Deleted

## 2014-11-07 LAB — RPR

## 2014-11-07 LAB — HIV ANTIBODY (ROUTINE TESTING W REFLEX): HIV 1&2 Ab, 4th Generation: NONREACTIVE

## 2014-12-28 ENCOUNTER — Telehealth: Payer: Self-pay

## 2014-12-28 NOTE — Telephone Encounter (Signed)
Message left for patient to return my call and advise.  

## 2014-12-28 NOTE — Telephone Encounter (Signed)
Pt was seen 11/06/14 about rash on face. Rash has not cleared completely and pt request rx without appt to [facility] [address]Please advise.

## 2014-12-28 NOTE — Telephone Encounter (Signed)
Was this the rash near the beard or at the scalp?

## 2014-12-29 NOTE — Telephone Encounter (Signed)
Spoke with patient and he advised that it is actually both places.

## 2014-12-31 MED ORDER — GRISEOFULVIN MICROSIZE 500 MG PO TABS: 500.0000 mg | ORAL_TABLET | Freq: Every day | ORAL | Status: DC

## 2014-12-31 NOTE — Telephone Encounter (Signed)
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 plz ensure he has stopped shaving, electric clippers are ok. Would treat with griseofulvin 500mg  daily for 4 weeks. If persistent rash despite treatment rec come in for re evaluation, and will need to monitor liver function on this med. Price out and let us know if too expensive - alternative would be terbinafine but I prefer griseofulvin (less side effects).

## 2015-01-01 NOTE — Telephone Encounter (Signed)
Patient notified. He confirmed that he has stopped shaving and is only using clippers when needed. He will check price of med and call if too expensive.

## 2015-01-23 ENCOUNTER — Telehealth: Payer: Self-pay | Admitting: *Deleted

## 2015-01-23 ENCOUNTER — Encounter: Payer: Self-pay | Admitting: Family Medicine

## 2015-01-23 NOTE — Telephone Encounter (Signed)
Form for dress code variance (beard) in your IN box for completion.

## 2015-01-25 NOTE — Telephone Encounter (Signed)
Form faxed back to patient

## 2015-01-25 NOTE — Telephone Encounter (Signed)
Filled and in Kim's box. 

## 2015-04-16 ENCOUNTER — Encounter: Payer: Self-pay | Admitting: *Deleted

## 2015-04-16 ENCOUNTER — Encounter: Payer: Self-pay | Admitting: Family Medicine

## 2015-04-16 ENCOUNTER — Ambulatory Visit (INDEPENDENT_AMBULATORY_CARE_PROVIDER_SITE_OTHER): Payer: 59 | Admitting: Family Medicine

## 2015-04-16 VITALS — BP 116/82 | HR 64 | Temp 98.1°F | Wt 210.0 lb

## 2015-04-16 DIAGNOSIS — N62 Hypertrophy of breast: Secondary | ICD-10-CM

## 2015-04-16 NOTE — Progress Notes (Signed)
Pre visit review using our clinic review tool, if applicable. No additional management support is needed unless otherwise documented below in the visit note. 

## 2015-04-16 NOTE — Progress Notes (Signed)
   BP 116/82 mmHg  Pulse 64  Temp(Src) 98.1 F (36.7 C) (Oral)  Wt 210 lb (95.255 kg)   CC: check knot on chest  Subjective:    Patient ID: Jorge Simpson, male    DOB: 08/10/1986, 29 y.o.   MRN: [id number]  HPI: Jorge Simpson is a 29 y.o. male presenting on 04/16/2015 for Knot in chest   3-4 mo h/o tenderness at nipples R then L. Now main concern is L sided discomfort. More noticeable when touching nipples or laying on abdomen. Once in the past he also had tender nipples but this resolved on its own. Feels mild lump on left. Appearance back to normal after working out or in cold weather. Has not gained weight unexpectedly. No mood changes that he's noticed.  Did take [employer] testosterone booster in January for 1 month. Denies recreational drugs. No other new medications, vitamins, OTC supplements.   He also did recently complete 1 mo course of griseofulvin but reviewing side effects gynecomastia is not listed as one.  Relevant past medical, surgical, family and social history reviewed and updated as indicated. Interim medical history since our last visit reviewed. Allergies and medications reviewed and updated. Current Outpatient Prescriptions on File Prior to Visit  Medication Sig  . Multiple Vitamins-Minerals (MULTIVITAMIN & MINERAL PO) Take 1 tablet by mouth daily.   No current facility-administered medications on file prior to visit.    Review of Systems Per HPI unless specifically indicated above     Objective:    BP 116/82 mmHg  Pulse 64  Temp(Src) 98.1 F (36.7 C) (Oral)  Wt 210 lb (95.255 kg)  Wt Readings from Last 3 Encounters:  04/16/15 210 lb (95.255 kg)  11/06/14 212 lb 12 oz (96.503 kg)  11/23/12 199 lb 4 oz (90.379 kg)    Physical Exam  Constitutional: He appears well-developed and well-nourished. No distress.  Pulmonary/Chest: Right breast exhibits mass and tenderness. Right breast exhibits no inverted nipple, no nipple discharge and no skin change. Left  breast exhibits mass and tenderness. Left breast exhibits no inverted nipple, no nipple discharge and no skin change.  Bilateral 1cm swelling centrally underlying nipples  Nursing note and vitals reviewed.     Assessment & Plan:   Problem List Items Addressed This Visit    Gynecomastia - Primary    Anticipate bilateral mild gynecomastia related to testosterone booster use early this year. Pt has already stopped offending drug. Reassurance provided. Update if no improvement noted over next several months. Consider labwork and tamoxifen?          Follow up plan: Return if symptoms worsen or fail to improve.

## 2015-04-16 NOTE — Patient Instructions (Signed)
Look at handout provided today

## 2015-04-16 NOTE — Assessment & Plan Note (Addendum)
Anticipate bilateral mild gynecomastia related to testosterone booster use early this year. Pt has already stopped offending drug. Reassurance provided. Update if no improvement noted over next several months. Consider labwork and tamoxifen?

## 2015-05-16 ENCOUNTER — Encounter: Payer: Self-pay | Admitting: Primary Care

## 2015-05-16 ENCOUNTER — Ambulatory Visit (INDEPENDENT_AMBULATORY_CARE_PROVIDER_SITE_OTHER): Payer: 59 | Admitting: Primary Care

## 2015-05-16 VITALS — BP 132/78 | HR 73 | Temp 97.5°F | Ht 74.0 in | Wt 203.0 lb

## 2015-05-16 DIAGNOSIS — L089 Local infection of the skin and subcutaneous tissue, unspecified: Secondary | ICD-10-CM | POA: Diagnosis not present

## 2015-05-16 DIAGNOSIS — S90424A Blister (nonthermal), right lesser toe(s), initial encounter: Secondary | ICD-10-CM | POA: Diagnosis not present

## 2015-05-16 MED ORDER — DOXYCYCLINE HYCLATE 100 MG PO TABS: 100.0000 mg | ORAL_TABLET | Freq: Two times a day (BID) | ORAL | Status: DC

## 2015-05-16 NOTE — Progress Notes (Signed)
Pre visit review using our clinic review tool, if applicable. No additional management support is needed unless otherwise documented below in the visit note. 

## 2015-05-16 NOTE — Patient Instructions (Signed)
Take 1 tablet by mouth twice daily for 10 days. Finish this medication completely as prescribed. Soak toe in warm water with 1/3 parts peroxide for 20 minutes twice daily until you leave for your trip. Keep toe protected during the day and open in the evening when home. Call me if no improvement in the next 3-4 days. Take care and have a fun trip!

## 2015-05-16 NOTE — Progress Notes (Signed)
Subjective:    Patient ID: Jorge Simpson, male    DOB: Feb 22, 1986, 29 y.o.   MRN: [id number]  HPI  Jorge Simpson is a 29 year old male who presents today with a chief complaint of drainage and swelling to his right great toe. Friday afternoon he noticed redness, swelling, blistering, and pain to his right great toe near the cuticle line on the left side. The pain wasn't very bothersome so he worked out at [employer] on Saturday and noticed increased blistering later that evening.  Yesterday his girlfriend broke open the blister which resulted in foul odor and white puss. Denies recent injury. He's not taken any OTC medication such as tylenol or ibuprofen.  Review of Systems  Constitutional: Negative for fever and chills.  Musculoskeletal: Negative for myalgias and arthralgias.       Discomfort to right great toe.  Skin: Positive for color change and wound.       No past medical history on file.  History   Social History  . Marital Status: Single    Spouse Name: N/A  . Number of Children: N/A  . Years of Education: N/A   Occupational History  . Not on file.   Social History Main Topics  . Smoking status: Never Smoker   . Smokeless tobacco: Never Used  . Alcohol Use: 0.0 oz/week    0 Standard drinks or equivalent per week     Comment: occasionally  . Drug Use: No  . Sexual Activity: Not on file   Other Topics Concern  . 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: works out at gym 5x/wk   Diet: good water, fruits/vegetables daily    Past Surgical History  Procedure Laterality Date  . Anterior cruciate ligament repair  2010    Family History  Problem Relation Age of Onset  . Cancer Mother 46    breast, met to bones  . Cancer Maternal Aunt     breast  . Cancer Paternal Grandfather     intestinal  . Cancer Paternal Grandmother     breast  .  Cancer Paternal Uncle     colon  . CAD Paternal Grandfather     MI  . Stroke Maternal Grandmother   . Hyperlipidemia Father   . Hypertension Father   . Diabetes Neg Hx     No Known Allergies  Current Outpatient Prescriptions on File Prior to Visit  Medication Sig Dispense Refill  . Multiple Vitamins-Minerals (MULTIVITAMIN & MINERAL PO) Take 1 tablet by mouth daily.     No current facility-administered medications on file prior to visit.    BP 132/78 mmHg  Pulse 73  Temp(Src) 97.5 F (36.4 C) (Oral)  Ht '6\' 2"'  (1.88 m)  Wt 203 lb (92.08 kg)  BMI 26.05 kg/m2  SpO2 96%     Objective:   Physical Exam  Constitutional: He does not appear ill.  Cardiovascular: Normal rate and regular rhythm.   Pulmonary/Chest: Effort normal and breath sounds normal.  Musculoskeletal: Normal range of motion.  Skin: Skin is warm. There is erythema.  Small open wound to right great toe near the left side of the cuticle line. Some serosanguinous fluid noted. Erythema present to cuticle line.           Assessment & Plan:  Cellulitis:  Swelling, erythema, slight  serosanguinous drainage to left lower corner from open blister. Warm soaks. Tylenol or ibuprofen for pain. Doxycycline BID 10 days. Call if no improvement or development of fevers/increased swelling or drainage.

## 2015-09-04 ENCOUNTER — Encounter: Payer: Self-pay | Admitting: Family Medicine

## 2015-09-04 ENCOUNTER — Encounter: Payer: Self-pay | Admitting: *Deleted

## 2015-09-04 ENCOUNTER — Ambulatory Visit (INDEPENDENT_AMBULATORY_CARE_PROVIDER_SITE_OTHER): Payer: 59 | Admitting: Family Medicine

## 2015-09-04 VITALS — BP 130/86 | HR 68 | Temp 98.1°F | Ht 74.0 in | Wt 203.8 lb

## 2015-09-04 DIAGNOSIS — N63 Unspecified lump in breast: Secondary | ICD-10-CM | POA: Diagnosis not present

## 2015-09-04 DIAGNOSIS — R740 Nonspecific elevation of levels of transaminase and lactic acid dehydrogenase [LDH]: Secondary | ICD-10-CM | POA: Diagnosis not present

## 2015-09-04 DIAGNOSIS — Z0000 Encounter for general adult medical examination without abnormal findings: Secondary | ICD-10-CM | POA: Diagnosis not present

## 2015-09-04 DIAGNOSIS — R7401 Elevation of levels of liver transaminase levels: Secondary | ICD-10-CM

## 2015-09-04 DIAGNOSIS — N6320 Unspecified lump in the left breast, unspecified quadrant: Secondary | ICD-10-CM | POA: Insufficient documentation

## 2015-09-04 LAB — COMPREHENSIVE METABOLIC PANEL
ALBUMIN: 4.5 g/dL (ref 3.5–5.2)
ALT: 24 U/L (ref 0–53)
AST: 33 U/L (ref 0–37)
Alkaline Phosphatase: 49 U/L (ref 39–117)
BILIRUBIN TOTAL: 0.9 mg/dL (ref 0.2–1.2)
BUN: 14 mg/dL (ref 6–23)
CALCIUM: 9.5 mg/dL (ref 8.4–10.5)
CO2: 30 mEq/L (ref 19–32)
CREATININE: 1.34 mg/dL (ref 0.40–1.50)
Chloride: 103 mEq/L (ref 96–112)
GFR: 79.8 mL/min (ref >60.00)
Glucose, Bld: 87 mg/dL (ref 70–99)
Potassium: 4 mEq/L (ref 3.5–5.1)
Sodium: 139 mEq/L (ref 135–145)
TOTAL PROTEIN: 7.5 g/dL (ref 6.0–8.3)

## 2015-09-04 NOTE — Progress Notes (Signed)
Pre visit review using our clinic review tool, if applicable. No additional management support is needed unless otherwise documented below in the visit note. 

## 2015-09-04 NOTE — Assessment & Plan Note (Signed)
Previous evaluation/exam consistent with gynecomastia R>L, thought due to testosterone booster he was using.  He has now stopped this. sxs overall resolved on right, but now over last few months noticing small lump just lateral to left nipple, slightly tender to palpation. Anticipate small LN.  In strong fmhx breast cancer, rec dx mammo and L breast US. Pt agrees.

## 2015-09-04 NOTE — Progress Notes (Signed)
BP 130/86 mmHg  Pulse 68  Temp(Src) 98.1 F (36.7 C) (Oral)  Ht  (1.88 m)  Wt 203 lb 12 oz (92.42 kg)  BMI 26.15 kg/m2   CC: CPE  Subjective:    Patient ID: Jorge Simpson, male    DOB: December 13, 1986, 29 y.o.   MRN: [id number]  HPI: Jorge Simpson is a 29 y.o. male presenting on 09/04/2015 for Annual Exam   States has new insurance.  Currently sexually active, 1 partner in last year. No concerns for STDs. Last STD screen 10/2014 and normal. Declines rpt testing today.  Persistent L>R nipple and breast soreness, now feeling lump. Pt stopped testosterone booster.   Preventative: No recent CPE Declines flu shot Tdap - today Seat belt use discussed Currently sexually active, 3 partners in past year. 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 Interim medical history since our last visit reviewed. Allergies and medications reviewed and updated. Current Outpatient Prescriptions on File Prior to Visit  Medication Sig  . Multiple Vitamins-Minerals (MULTIVITAMIN & MINERAL PO) Take 1 tablet by mouth daily.   No current facility-administered medications on file prior to visit.    Review of Systems  Constitutional: Negative for fever, chills, activity change, appetite change, fatigue and unexpected weight change.  HENT: Negative for hearing loss.   Eyes: Negative for visual disturbance.  Respiratory: Negative for cough, chest tightness, shortness of breath and wheezing.   Cardiovascular: Negative for chest pain, palpitations and leg swelling.  Gastrointestinal: Negative for nausea, vomiting, abdominal pain, diarrhea, constipation, blood in stool and abdominal distention.  Genitourinary: Negative for hematuria and  difficulty urinating.  Musculoskeletal: Negative for myalgias, arthralgias and neck pain.  Skin: Negative for rash.  Neurological: Negative for dizziness, seizures, syncope and headaches.  Hematological: Negative for adenopathy. Does not bruise/bleed easily.  Psychiatric/Behavioral: Negative for dysphoric mood. The patient is not nervous/anxious.    Per HPI unless specifically indicated above     Objective:    BP 130/86 mmHg  Pulse 68  Temp(Src) 98.1 F (36.7 C) (Oral)  Ht  (1.88 m)  Wt 203 lb 12 oz (92.42 kg)  BMI 26.15 kg/m2  Wt Readings from Last 3 Encounters:  09/04/15 203 lb 12 oz (92.42 kg)  05/16/15 203 lb (92.08 kg)  04/16/15 210 lb (95.255 kg)    Physical Exam  Constitutional: He is oriented to person, place, and time. He appears well-developed and well-nourished. No distress.  HENT:  Head: Normocephalic and atraumatic.  Right Ear: Hearing, tympanic membrane, external ear and ear canal normal.  Left Ear: Hearing, tympanic membrane, external ear and ear canal normal.  Nose: Nose normal.  Mouth/Throat: Uvula is midline, oropharynx is clear and moist and mucous membranes are normal. No oropharyngeal exudate, posterior oropharyngeal edema or posterior oropharyngeal erythema.  Eyes: Conjunctivae and EOM are normal. Pupils are equal, round, and reactive to light. No scleral icterus.  Neck: Normal range of motion. Neck supple. No thyromegaly present.  Cardiovascular: Normal rate, regular rhythm, normal heart sounds and intact distal pulses.   No murmur heard. Pulses:      Radial pulses are 2+ on the right side, and 2+ on the left side.  Pulmonary/Chest: Effort normal and breath sounds normal. No respiratory distress. He has no wheezes.  He has no rales. Right breast exhibits no inverted nipple, no mass, no nipple discharge, no skin change and no tenderness. Left breast exhibits mass (smal lump 2-3 o clock about 2cm from nipple). Left breast exhibits no inverted nipple, no  nipple discharge, no skin change and no tenderness.  Abdominal: Soft. Bowel sounds are normal. He exhibits no distension and no mass. There is no tenderness. There is no rebound and no guarding.  Musculoskeletal: Normal range of motion. He exhibits no edema.  Lymphadenopathy:       Head (right side): No submental, no submandibular, no tonsillar, no preauricular and no posterior auricular adenopathy present.       Head (left side): No submental, no submandibular, no tonsillar, no preauricular and no posterior auricular adenopathy present.    He has no cervical adenopathy.    He has no axillary adenopathy.       Left axillary: No lateral adenopathy present.      Right: No supraclavicular adenopathy present.       Left: No supraclavicular adenopathy present.  Neurological: He is alert and oriented to person, place, and time.  CN grossly intact, station and gait intact  Skin: Skin is warm and dry. No rash noted.  Psychiatric: He has a normal mood and affect. His behavior is normal. Judgment and thought content normal.  Nursing note and vitals reviewed.  Results for orders placed or performed in visit on 11/06/14  Comprehensive metabolic panel  Result Value Ref Range   Sodium 139 135 - 145 mEq/L   Potassium 4.2 3.5 - 5.1 mEq/L   Chloride 103 96 - 112 mEq/L   CO2 27 19 - 32 mEq/L   Glucose, Bld 87 70 - 99 mg/dL   BUN 14 6 - 23 mg/dL   Creatinine, Ser 1.4 0.4 - 1.5 mg/dL   Total Bilirubin 0.9 0.2 - 1.2 mg/dL   Alkaline Phosphatase 49 39 - 117 U/L   AST 40 (H) 0 - 37 U/L   ALT 32 0 - 53 U/L   Total Protein 7.7 6.0 - 8.3 g/dL   Albumin 4.5 3.5 - 5.2 g/dL   Calcium 9.3 8.4 - 16.1 mg/dL   GFR 09.60 >45.40 mL/min  Lipid panel  Result Value Ref Range   Cholesterol 185 0 - 200 mg/dL   Triglycerides 98.1 0.0 - 149.0 mg/dL   HDL 19.14 >78.29 mg/dL   VLDL 56.2 0.0 - 13.0 mg/dL   LDL Cholesterol 865 (H) 0 - 99 mg/dL   Total CHOL/HDL Ratio 5    NonHDL 144.10   TSH  Result Value Ref Range    TSH 1.19 0.35 - 4.50 uIU/mL  RPR  Result Value Ref Range   RPR Ser Ql NON REAC NON REAC  HIV antibody  Result Value Ref Range   HIV 1&2 Ab, 4th Generation NONREACTIVE NONREACTIVE      Assessment & Plan:   Problem List Items Addressed This Visit    Transaminitis    Recheck today. S/p normal abd [place], iron levels, viral hep C panel      Relevant Orders   Comprehensive metabolic panel   Health maintenance examination - Primary    Preventative protocols reviewed and updated unless pt declined. Discussed healthy diet and lifestyle.       Left breast lump    Previous evaluation/exam consistent with gynecomastia R>L, thought due to testosterone booster he was using.  He has now stopped this. sxs overall resolved on right, but now over last  few months noticing small lump just lateral to left nipple, slightly tender to palpation. Anticipate small LN.  In strong fmhx breast cancer, rec dx mammo and L breast US. Pt agrees.      Relevant Orders   MM Digital Diagnostic Bilat   US BREAST COMPLETE UNI LEFT INC AXILLA       Follow up plan: Return in about 1 year (around 09/03/2016), or as needed, for annual exam, prior fasting for blood work.

## 2015-09-04 NOTE — Patient Instructions (Signed)
labwork today. Pass by [facility]'s office to schedule appointment for ultrasound of left chest wall.  Health Maintenance A healthy lifestyle and preventative care can promote health and wellness.  Maintain regular health, dental, and eye exams.  Eat a healthy diet. Foods like vegetables, fruits, whole grains, low-fat dairy products, and lean protein foods contain the nutrients you need and are low in calories. Decrease your intake of foods high in solid fats, added sugars, and salt. Get information about a proper diet from your health care provider, if necessary.  Regular physical exercise is one of the most important things you can do for your health. Most adults should get at least 150 minutes of moderate-intensity exercise (any activity that increases your heart rate and causes you to sweat) each week. In addition, most adults need muscle-strengthening exercises on 2 or more days a week.   Maintain a healthy weight. The body mass index (BMI) is a screening tool to identify possible weight problems. It provides an estimate of body fat based on height and weight. Your health care provider can find your BMI and can help you achieve or maintain a healthy weight. For males 20 years and older:  A BMI below 18.5 is considered underweight.  A BMI of 18.5 to 24.9 is normal.  A BMI of 25 to 29.9 is considered overweight.  A BMI of 30 and above is considered obese.  Maintain normal blood lipids and cholesterol by exercising and minimizing your intake of saturated fat. Eat a balanced diet with plenty of fruits and vegetables. Blood tests for lipids and cholesterol should begin at age 73 and be repeated every 5 years. If your lipid or cholesterol levels are high, you are over age 42, or you are at high risk for heart disease, you may need your cholesterol levels checked more frequently.Ongoing high lipid and cholesterol levels should be treated with medicines if diet and exercise are not working.  If  you smoke, find out from your health care provider how to quit. If you do not use tobacco, do not start.  Lung cancer screening is recommended for adults aged 55-80 years who are at high risk for developing lung cancer because of a history of smoking. A yearly low-dose CT scan of the lungs is recommended for people who have at least a 30-pack-year history of smoking and are current smokers or have quit within the past 15 years. A pack year of smoking is smoking an average of 1 pack of cigarettes a day for 1 year (for example, a 30-pack-year history of smoking could mean smoking 1 pack a day for 30 years or 2 packs a day for 15 years). Yearly screening should continue until the smoker has stopped smoking for at least 15 years. Yearly screening should be stopped for people who develop a health problem that would prevent them from having lung cancer treatment.  If you choose to drink alcohol, do not have more than 2 drinks per day. One drink is considered to be 12 oz (360 mL) of beer, 5 oz (150 mL) of wine, or 1.5 oz (45 mL) of liquor.  Avoid the use of street drugs. Do not share needles with anyone. Ask for help if you need support or instructions about stopping the use of drugs.  High blood pressure causes heart disease and increases the risk of stroke. Blood pressure should be checked at least every 1-2 years. Ongoing high blood pressure should be treated with medicines if weight loss and exercise  are not effective.  If you are 93-13 years old, ask your health care provider if you should take aspirin to prevent heart disease.  Diabetes screening involves taking a blood sample to check your fasting blood sugar level. This should be done once every 3 years after age 21 if you are at a normal weight and without risk factors for diabetes. Testing should be considered at a younger age or be carried out more frequently if you are overweight and have at least 1 risk factor for diabetes.  Colorectal cancer can  be detected and often prevented. Most routine colorectal cancer screening begins at the age of 24 and continues through age 38. However, your health care provider may recommend screening at an earlier age if you have risk factors for colon cancer. On a yearly basis, your health care provider may provide home test kits to check for hidden blood in the stool. A small camera at the end of a tube may be used to directly examine the colon (sigmoidoscopy or colonoscopy) to detect the earliest forms of colorectal cancer. Talk to your health care provider about this at age 52 when routine screening begins. A direct exam of the colon should be repeated every 5-10 years through age 16, unless early forms of precancerous polyps or small growths are found.  People who are at an increased risk for hepatitis B should be screened for this virus. You are considered at high risk for hepatitis B if:  You were born in a country where hepatitis B occurs often. Talk with your health care provider about which countries are considered high risk.  Your parents were born in a high-risk country and you have not received a shot to protect against hepatitis B (hepatitis B vaccine).  You have HIV or AIDS.  You use needles to inject street drugs.  You live with, or have sex with, someone who has hepatitis B.  You are a man who has sex with other men (MSM).  You get hemodialysis treatment.  You take certain medicines for conditions like cancer, organ transplantation, and autoimmune conditions.  Hepatitis C blood testing is recommended for all people born from [birth year] through 1965 and any individual with known risk factors for hepatitis C.  Healthy men should no longer receive prostate-specific antigen (PSA) blood tests as part of routine cancer screening. Talk to your health care provider about prostate cancer screening.  Testicular cancer screening is not recommended for adolescents or adult males who have no symptoms.  Screening includes self-exam, a health care provider exam, and other screening tests. Consult with your health care provider about any symptoms you have or any concerns you have about testicular cancer.  Practice safe sex. Use condoms and avoid high-risk sexual practices to reduce the spread of sexually transmitted infections (STIs).  You should be screened for STIs, including gonorrhea and chlamydia if:  You are sexually active and are younger than 24 years.  You are older than 24 years, and your health care provider tells you that you are at risk for this type of infection.  Your sexual activity has changed since you were last screened, and you are at an increased risk for chlamydia or gonorrhea. Ask your health care provider if you are at risk.  If you are at risk of being infected with HIV, it is recommended that you take a prescription medicine daily to prevent HIV infection. This is called pre-exposure prophylaxis (PrEP). You are considered at risk if:  You are  a man who has sex with other men (MSM).  You are a heterosexual man who is sexually active with multiple partners.  You take drugs by injection.  You are sexually active with a partner who has HIV.  Talk with your health care provider about whether you are at high risk of being infected with HIV. If you choose to begin PrEP, you should first be tested for HIV. You should then be tested every 3 months for as long as you are taking PrEP.  Use sunscreen. Apply sunscreen liberally and repeatedly throughout the day. You should seek shade when your shadow is shorter than you. Protect yourself by wearing long sleeves, pants, a wide-brimmed hat, and sunglasses year round whenever you are outdoors.  Tell your health care provider of new moles or changes in moles, especially if there is a change in shape or color. Also, tell your health care provider if a mole is larger than the size of a pencil eraser.  A one-time screening for  abdominal aortic aneurysm (AAA) and surgical repair of large AAAs by ultrasound is recommended for men aged 65-75 years who are current or former smokers.  Stay current with your vaccines (immunizations). Document Released: 05/29/2008 Document Revised: 12/06/2013 Document Reviewed: 04/28/2011 [facility] LP[facility] Patient Information 2015 [place], [place]. This information is not intended to replace advice given to you by your health care provider. Make sure you discuss any questions you have with your health care provider.

## 2015-09-04 NOTE — Assessment & Plan Note (Signed)
Preventative protocols reviewed and updated unless pt declined. Discussed healthy diet and lifestyle.  

## 2015-09-04 NOTE — Assessment & Plan Note (Signed)
Recheck today. S/p normal abd [place], iron levels, viral hep C panel

## 2015-09-07 ENCOUNTER — Ambulatory Visit
Admission: RE | Admit: 2015-09-07 | Discharge: 2015-09-07 | Disposition: A | Discharge status: Home / Self Care | Payer: 59 | Source: Ambulatory Visit | Attending: Family Medicine | Admitting: Family Medicine

## 2015-09-07 DIAGNOSIS — N6320 Unspecified lump in the left breast, unspecified quadrant: Secondary | ICD-10-CM

## 2017-05-26 ENCOUNTER — Encounter: Payer: Self-pay | Admitting: Family Medicine

## 2017-05-26 ENCOUNTER — Ambulatory Visit (INDEPENDENT_AMBULATORY_CARE_PROVIDER_SITE_OTHER): Payer: 59 | Admitting: Family Medicine

## 2017-05-26 VITALS — BP 110/70 | HR 71 | Temp 97.6°F | Ht 74.0 in | Wt 216.0 lb

## 2017-05-26 DIAGNOSIS — N62 Hypertrophy of breast: Secondary | ICD-10-CM | POA: Diagnosis not present

## 2017-05-26 DIAGNOSIS — Z23 Encounter for immunization: Secondary | ICD-10-CM

## 2017-05-26 DIAGNOSIS — Z0001 Encounter for general adult medical examination with abnormal findings: Secondary | ICD-10-CM | POA: Diagnosis not present

## 2017-05-26 DIAGNOSIS — E785 Hyperlipidemia, unspecified: Secondary | ICD-10-CM

## 2017-05-26 NOTE — Assessment & Plan Note (Signed)
Mild off meds. Latest LDL 130s.

## 2017-05-26 NOTE — Assessment & Plan Note (Addendum)
L>R, tender. Ongoing for 2.5 yrs. Started after using testosterone booster 2016 which he has since stopped. Pt states this remains concern given ongoing discomfort, painful when laying prone.  Will proceed with hormonal evaluation given persistent tenderness - will return for AM draw. As pt interested in definitive management, will consider referral for surgical eval vs 3-6 mo trial tamoxifen.

## 2017-05-26 NOTE — Patient Instructions (Addendum)
Tdap today (tetanus and whooping cough).  You are doing well today. Congratulations on upcoming baby! Return one morning at 8am for labs.  Return as needed or in 2 years for next physical.   Health Maintenance, Male A healthy lifestyle and preventive care is important for your health and wellness. Ask your health care provider about what schedule of regular examinations is right for you. What should I know about weight and diet? Eat a Healthy Diet  Eat plenty of vegetables, fruits, whole grains, low-fat dairy products, and lean protein.  Do not eat a lot of foods high in solid fats, added sugars, or salt.  Maintain a Healthy Weight Regular exercise can help you achieve or maintain a healthy weight. You should:  Do at least 150 minutes of exercise each week. The exercise should increase your heart rate and make you sweat (moderate-intensity exercise).  Do strength-training exercises at least twice a week.  Watch Your Levels of Cholesterol and Blood Lipids  Have your blood tested for lipids and cholesterol every 5 years starting at 33 years of age. If you are at high risk for heart disease, you should start having your blood tested when you are 33 years old. You may need to have your cholesterol levels checked more often if: ? Your lipid or cholesterol levels are high. ? You are older than 33 years of age. ? You are at high risk for heart disease.  What should I know about cancer screening? Many types of cancers can be detected early and may often be prevented. Lung Cancer  You should be screened every year for lung cancer if: ? You are a current smoker who has smoked for at least 30 years. ? You are a former smoker who has quit within the past 15 years.  Talk to your health care provider about your screening options, when you should start screening, and how often you should be screened.  Colorectal Cancer  Routine colorectal cancer screening usually begins at 33 years of age  and should be repeated every 5-10 years until you are 33 years old. You may need to be screened more often if early forms of precancerous polyps or small growths are found. Your health care provider may recommend screening at an earlier age if you have risk factors for colon cancer.  Your health care provider may recommend using home test kits to check for hidden blood in the stool.  A small camera at the end of a tube can be used to examine your colon (sigmoidoscopy or colonoscopy). This checks for the earliest forms of colorectal cancer.  Prostate and Testicular Cancer  Depending on your age and overall health, your health care provider may do certain tests to screen for prostate and testicular cancer.  Talk to your health care provider about any symptoms or concerns you have about testicular or prostate cancer.  Skin Cancer  Check your skin from head to toe regularly.  Tell your health care provider about any new moles or changes in moles, especially if: ? There is a change in a mole's size, shape, or color. ? You have a mole that is larger than a pencil eraser.  Always use sunscreen. Apply sunscreen liberally and repeat throughout the day.  Protect yourself by wearing long sleeves, pants, a wide-brimmed hat, and sunglasses when outside.  What should I know about heart disease, diabetes, and high blood pressure?  If you are 7518-33 years of age, have your blood pressure checked every 3-5  years. If you are 92 years of age or older, have your blood pressure checked every year. You should have your blood pressure measured twice-once when you are at a hospital or clinic, and once when you are not at a hospital or clinic. Record the average of the two measurements. To check your blood pressure when you are not at a hospital or clinic, you can use: ? An automated blood pressure machine at a pharmacy. ? A home blood pressure monitor.  Talk to your health care provider about your target blood  pressure.  If you are between 67-68 years old, ask your health care provider if you should take aspirin to prevent heart disease.  Have regular diabetes screenings by checking your fasting blood sugar level. ? If you are at a normal weight and have a low risk for diabetes, have this test once every three years after the age of 66. ? If you are overweight and have a high risk for diabetes, consider being tested at a younger age or more often.  A one-time screening for abdominal aortic aneurysm (AAA) by ultrasound is recommended for men aged 69-75 years who are current or former smokers. What should I know about preventing infection? Hepatitis B If you have a higher risk for hepatitis B, you should be screened for this virus. Talk with your health care provider to find out if you are at risk for hepatitis B infection. Hepatitis C Blood testing is recommended for:  Everyone born from [birth year] through 1967.  Anyone with known risk factors for hepatitis C.  Sexually Transmitted Diseases (STDs)  You should be screened each year for STDs including gonorrhea and chlamydia if: ? You are sexually active and are younger than 33 years of age. ? You are older than 33 years of age and your health care provider tells you that you are at risk for this type of infection. ? Your sexual activity has changed since you were last screened and you are at an increased risk for chlamydia or gonorrhea. Ask your health care provider if you are at risk.  Talk with your health care provider about whether you are at high risk of being infected with HIV. Your health care provider may recommend a prescription medicine to help prevent HIV infection.  What else can I do?  Schedule regular health, dental, and eye exams.  Stay current with your vaccines (immunizations).  Do not use any tobacco products, such as cigarettes, chewing tobacco, and e-cigarettes. If you need help quitting, ask your health care  provider.  Limit alcohol intake to no more than 2 drinks per day. One drink equals 12 ounces of beer, 5 ounces of wine, or 1 ounces of hard liquor.  Do not use street drugs.  Do not share needles.  Ask your health care provider for help if you need support or information about quitting drugs.  Tell your health care provider if you often feel depressed.  Tell your health care provider if you have ever been abused or do not feel safe at home. This information is not intended to replace advice given to you by your health care provider. Make sure you discuss any questions you have with your health care provider. Document Released: 05/29/2008 Document Revised: 07/30/2016 Document Reviewed: 09/04/2015 Elsevier Interactive Patient Education  [employer].

## 2017-05-26 NOTE — Progress Notes (Signed)
BP 110/70   Pulse 71   Temp 97.6 F (36.4 C) (Oral)   Ht 6\' 2"  (1.88 m)   Wt 216 lb (98 kg)   SpO2 98%   BMI 27.73 kg/m    CC: CPE Subjective:    Patient ID: Jorge Simpson, male    DOB: 1986/04/25, 31 y.o.   MRN: [id number]  HPI: Jorge Sheeley is a 31 y.o. male presenting on 05/26/2017 for Annual Exam   Ongoing pain and discomfort of left gynecomastia for last 3-4 yrs s/p benign imaging. Random discomfort. Interested in surgical referral for removal.  Expecting new baby boy next month. Needs Tdap.   Preventative: Last labs 2015 Does not receive flu shot Tdap - today Seat belt use discussed Sunscreen use discussed. No changing moles on skin.  Currently sexually active.  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 Interim medical history since our last visit reviewed. Allergies and medications reviewed and updated. Outpatient Medications Prior to Visit  Medication Sig Dispense Refill  . Multiple Vitamins-Minerals (MULTIVITAMIN & MINERAL PO) Take 1 tablet by mouth daily.     No facility-administered medications prior to visit.      Per HPI unless specifically indicated in ROS section below Review of Systems  Constitutional: Negative for activity change, appetite change, chills, fatigue, fever and unexpected weight change.  HENT: Negative for hearing loss.   Eyes: Negative for visual disturbance.  Respiratory: Negative for cough, chest tightness, shortness of breath and wheezing.   Cardiovascular: Negative for chest pain, palpitations and leg swelling.  Gastrointestinal: Negative for abdominal distention, abdominal pain, blood in stool,  constipation, diarrhea, nausea and vomiting.  Genitourinary: Negative for difficulty urinating and hematuria.  Musculoskeletal: Negative for arthralgias, myalgias and neck pain.  Skin: Negative for rash.  Neurological: Negative for dizziness, seizures, syncope and headaches.  Hematological: Negative for adenopathy. Does not bruise/bleed easily.  Psychiatric/Behavioral: Negative for dysphoric mood. The patient is not nervous/anxious.        Objective:    BP 110/70   Pulse 71   Temp 97.6 F (36.4 C) (Oral)   Ht 6\' 2"  (1.88 m)   Wt 216 lb (98 kg)   SpO2 98%   BMI 27.73 kg/m   Wt Readings from Last 3 Encounters:  05/26/17 216 lb (98 kg)  09/04/15 203 lb 12 oz (92.4 kg)  05/16/15 203 lb (92.1 kg)    Physical Exam  Constitutional: He is oriented to person, place, and time. He appears well-developed and well-nourished. No distress.  HENT:  Head: Normocephalic and atraumatic.  Right Ear: Hearing, tympanic membrane, external ear and ear canal normal.  Left Ear: Hearing, tympanic membrane, external ear and ear canal normal.  Nose: Nose normal.  Mouth/Throat: Uvula is midline, oropharynx is clear and moist and mucous membranes are normal. No oropharyngeal exudate, posterior oropharyngeal edema or posterior oropharyngeal erythema.  Eyes: Conjunctivae and EOM are normal. Pupils are equal, round, and reactive to light. No scleral icterus.  Neck: Normal range of motion. Neck supple. No thyromegaly present.  Cardiovascular: Normal rate, regular rhythm, normal heart sounds and intact distal pulses.   No murmur heard. Pulses:      Radial pulses  are 2+ on the right side, and 2+ on the left side.  Pulmonary/Chest: Effort normal and breath sounds normal. No respiratory distress. He has no wheezes. He has no rales.  Mildly tender L>R gynecomastia  Abdominal: Soft. Bowel sounds are normal. He exhibits no distension and no mass. There is no tenderness. There is no rebound and no guarding.    Musculoskeletal: Normal range of motion. He exhibits no edema.  Lymphadenopathy:    He has no cervical adenopathy.  Neurological: He is alert and oriented to person, place, and time.  CN grossly intact, station and gait intact  Skin: Skin is warm and dry. No rash noted.  Psychiatric: He has a normal mood and affect. His behavior is normal. Judgment and thought content normal.  Nursing note and vitals reviewed.  Lab Results  Component Value Date   CHOL 185 11/06/2014   HDL 40.90 11/06/2014   LDLCALC 132 (H) 11/06/2014   LDLDIRECT 228.7 06/02/2011   TRIG 62.0 11/06/2014   CHOLHDL 5 11/06/2014       Assessment & Plan:   Problem List Items Addressed This Visit    Encounter for general adult medical examination with abnormal findings - Primary    Preventative protocols reviewed and updated unless pt declined. Discussed healthy diet and lifestyle.       Gynecomastia    L>R, tender. Ongoing for 2.5 yrs. Started after using testosterone booster 2016 which he has since stopped. Pt states this remains concern given ongoing discomfort, painful when laying prone.  Will proceed with hormonal evaluation given persistent tenderness - will return for AM draw. As pt interested in definitive management, will consider referral for surgical eval vs 3-6 mo trial tamoxifen.       Relevant Orders   HCG, Tumor Marker   Testosterone   Luteinizing hormone   Estradiol   TSH   HLD (hyperlipidemia)    Mild off meds. Latest LDL 130s.          Follow up plan: Return in about 2 years (around 05/27/2019) for annual exam, prior fasting for blood work.  Eustaquio BoydenJavier Theresea Trautmann, MD

## 2017-05-26 NOTE — Addendum Note (Signed)
Addended by: Annamarie MajorFUQUAY, Brittani Purdum S on: 05/26/2017 11:59 AM   Modules accepted: Orders

## 2017-05-26 NOTE — Assessment & Plan Note (Signed)
Preventative protocols reviewed and updated unless pt declined. Discussed healthy diet and lifestyle.  

## 2017-05-29 ENCOUNTER — Other Ambulatory Visit: Payer: Self-pay | Admitting: Family Medicine

## 2017-05-29 DIAGNOSIS — N62 Hypertrophy of breast: Secondary | ICD-10-CM

## 2017-06-01 ENCOUNTER — Other Ambulatory Visit (INDEPENDENT_AMBULATORY_CARE_PROVIDER_SITE_OTHER): Payer: 59

## 2017-06-01 DIAGNOSIS — N62 Hypertrophy of breast: Secondary | ICD-10-CM | POA: Diagnosis not present

## 2017-06-01 LAB — TSH: TSH: 1.05 u[IU]/mL (ref 0.35–4.50)

## 2017-06-01 LAB — TESTOSTERONE: TESTOSTERONE: 438.02 ng/dL (ref 300.00–890.00)

## 2017-06-01 LAB — LUTEINIZING HORMONE: LH: 5.31 m[IU]/mL (ref 1.50–9.30)

## 2017-06-02 LAB — ESTRADIOL: ESTRADIOL: 27 pg/mL (ref <=39)

## 2017-06-02 LAB — BETA HCG QUANT (REF LAB)

## 2017-06-04 ENCOUNTER — Encounter: Payer: Self-pay | Admitting: Family Medicine

## 2017-06-04 MED ORDER — TAMOXIFEN CITRATE 20 MG PO TABS: 20.0000 mg | ORAL_TABLET | Freq: Every day | ORAL | 2 refills | Status: DC

## 2017-07-06 ENCOUNTER — Other Ambulatory Visit: Payer: Self-pay | Admitting: Family Medicine

## 2017-09-05 ENCOUNTER — Other Ambulatory Visit: Payer: Self-pay | Admitting: Family Medicine

## 2017-09-21 ENCOUNTER — Encounter: Payer: Self-pay | Admitting: Family Medicine

## 2018-01-17 ENCOUNTER — Other Ambulatory Visit: Payer: Self-pay | Admitting: Family Medicine

## 2018-01-19 NOTE — Telephone Encounter (Signed)
Last filled 12/09/17, #30 Last OV (CPE):  05/26/17 Next OV:  none

## 2018-01-20 NOTE — Telephone Encounter (Signed)
Denied. Completed course. If ongoing concerns consider surg referral.

## 2019-02-22 ENCOUNTER — Encounter: Payer: Self-pay | Admitting: Family Medicine

## 2019-02-24 ENCOUNTER — Ambulatory Visit (INDEPENDENT_AMBULATORY_CARE_PROVIDER_SITE_OTHER): Payer: Managed Care, Other (non HMO) | Admitting: Family Medicine

## 2019-02-24 ENCOUNTER — Other Ambulatory Visit: Payer: Self-pay

## 2019-02-24 ENCOUNTER — Encounter: Payer: Self-pay | Admitting: Family Medicine

## 2019-02-24 VITALS — BP 122/76 | HR 82 | Temp 97.8°F | Ht 74.0 in | Wt 210.2 lb

## 2019-02-24 DIAGNOSIS — M25512 Pain in left shoulder: Secondary | ICD-10-CM

## 2019-02-24 DIAGNOSIS — M25519 Pain in unspecified shoulder: Secondary | ICD-10-CM | POA: Insufficient documentation

## 2019-02-24 NOTE — Progress Notes (Signed)
BP 122/76 (BP Location: Left Arm, Patient Position: Sitting, Cuff Size: Normal)   Pulse 82   Temp 97.8 F (36.6 C) (Oral)   Ht 6\' 2"  (1.88 m)   Wt 210 lb 3 oz (95.3 kg)   SpO2 97%   BMI 26.99 kg/m    CC: L shoulder knot and pain Subjective:    Patient ID: Jorge Simpson, male    DOB: 12-29-1985, 33 y.o.   MRN: [id number]  HPI: Jorge Simpson is a 33 y.o. male presenting on 02/24/2019 for Cyst (C/o knot on top of left shoulder. Noticed on 02/21/19. Has had pain/tightness in the area about 2 wks. )    2 wk h/o L trap tightness and pain noticed while working out, then noticed tender/tight knot at superior shoulder. Stretching helps.   Denies fevers/chills, redness or warmth of area, rash, neck pain No inciting trauma, falls or injury.  No other joint pains.       Relevant past medical, surgical, family and social history reviewed and updated as indicated. Interim medical history since our last visit reviewed. Allergies and medications reviewed and updated. Outpatient Medications Prior to Visit  Medication Sig Dispense Refill  . Multiple Vitamins-Minerals (MULTIVITAMIN & MINERAL PO) Take 1 tablet by mouth daily.    . naproxen (NAPROSYN) 500 MG tablet Take one po bid x 1 week then prn pain, take with food 40 tablet 0  . tamoxifen (NOLVADEX) 20 MG tablet TAKE 1 TABLET BY MOUTH EVERY DAY 30 tablet 2   No facility-administered medications prior to visit.      Per HPI unless specifically indicated in ROS section below Review of Systems Objective:    BP 122/76 (BP Location: Left Arm, Patient Position: Sitting, Cuff Size: Normal)   Pulse 82   Temp 97.8 F (36.6 C) (Oral)   Ht 6\' 2"  (1.88 m)   Wt 210 lb 3 oz (95.3 kg)   SpO2 97%   BMI 26.99 kg/m   Wt Readings from Last 3 Encounters:  02/24/19 210 lb 3 oz (95.3 kg)  05/26/17 216 lb (98 kg)  09/04/15 203 lb 12 oz (92.4 kg)    Physical Exam Vitals signs and nursing note reviewed.  Constitutional:      Appearance: Normal  appearance. He is not ill-appearing.  Neck:     Musculoskeletal: Normal range of motion and neck supple. No neck rigidity or muscular tenderness.  Musculoskeletal: Normal range of motion.        General: Tenderness present. No swelling.     Comments: Bilateral shoulder exam: No deformity of shoulders on inspection. Tender to palpation at L [facility] joint with swelling noted as well.  FROM in abduction and forward flexion. No impingement. No pain with crossover test. No pain with rotation of humeral head in GH joint.   Neurological:     Mental Status: He is alert.       Assessment & Plan:   Problem List Items Addressed This Visit    AC joint pain - Primary    Pain of left AC joint over the last few weeks with reproducible tenderness to palpation, but denies any mechanism for shoulder separation injury. No other joints affected pointing against systemic inflammatory arthritis condition. Possibly just a strain. Will treat with short course of naprosyn 500mg  bid x 5d with meals, then PRN.  No xrays available today, but advised if ongoing pain despite above, to let us know for xray of L AC joint. Pt agrees with plan.  No orders of the defined types were placed in this encounter.  No orders of the defined types were placed in this encounter.   Follow up plan: No follow-ups on file.  Ria Bush, MD

## 2019-02-24 NOTE — Assessment & Plan Note (Signed)
Pain of left AC joint over the last few weeks with reproducible tenderness to palpation, but denies any mechanism for shoulder separation injury. No other joints affected pointing against systemic inflammatory arthritis condition. Possibly just a strain. Will treat with short course of naprosyn 500mg  bid x 5d with meals, then PRN.  No xrays available today, but advised if ongoing pain despite above, to let us know for xray of L AC joint. Pt agrees with plan.

## 2019-07-07 ENCOUNTER — Other Ambulatory Visit: Payer: Self-pay | Admitting: Family Medicine

## 2019-07-07 DIAGNOSIS — E785 Hyperlipidemia, unspecified: Secondary | ICD-10-CM

## 2019-07-07 DIAGNOSIS — R7401 Elevation of levels of liver transaminase levels: Secondary | ICD-10-CM

## 2019-07-08 ENCOUNTER — Other Ambulatory Visit (INDEPENDENT_AMBULATORY_CARE_PROVIDER_SITE_OTHER): Payer: Managed Care, Other (non HMO)

## 2019-07-08 ENCOUNTER — Other Ambulatory Visit: Payer: Self-pay

## 2019-07-08 DIAGNOSIS — E785 Hyperlipidemia, unspecified: Secondary | ICD-10-CM

## 2019-07-08 DIAGNOSIS — R740 Nonspecific elevation of levels of transaminase and lactic acid dehydrogenase [LDH]: Secondary | ICD-10-CM | POA: Diagnosis not present

## 2019-07-08 DIAGNOSIS — R7401 Elevation of levels of liver transaminase levels: Secondary | ICD-10-CM

## 2019-07-08 LAB — COMPREHENSIVE METABOLIC PANEL
ALT: 19 U/L (ref 0–53)
AST: 21 U/L (ref 0–37)
Albumin: 4.6 g/dL (ref 3.5–5.2)
Alkaline Phosphatase: 52 U/L (ref 39–117)
BUN: 12 mg/dL (ref 6–23)
CO2: 28 mEq/L (ref 19–32)
Calcium: 9.4 mg/dL (ref 8.4–10.5)
Chloride: 106 mEq/L (ref 96–112)
Creatinine, Ser: 1.29 mg/dL (ref 0.40–1.50)
GFR: 76.63 mL/min (ref >60.00)
Glucose, Bld: 88 mg/dL (ref 70–99)
Potassium: 4 mEq/L (ref 3.5–5.1)
Sodium: 141 mEq/L (ref 135–145)
Total Bilirubin: 0.6 mg/dL (ref 0.2–1.2)
Total Protein: 7 g/dL (ref 6.0–8.3)

## 2019-07-08 LAB — LIPID PANEL
Cholesterol: 194 mg/dL (ref 0–200)
HDL: 36.3 mg/dL — ABNORMAL LOW (ref >39.00)
LDL Cholesterol: 142 mg/dL — ABNORMAL HIGH (ref 0–99)
NonHDL: 157.76
Total CHOL/HDL Ratio: 5
Triglycerides: 79 mg/dL (ref 0.0–149.0)
VLDL: 15.8 mg/dL (ref 0.0–40.0)

## 2019-07-08 LAB — TSH: TSH: 1.1 u[IU]/mL (ref 0.35–4.50)

## 2019-07-12 ENCOUNTER — Other Ambulatory Visit: Payer: Self-pay

## 2019-07-12 ENCOUNTER — Encounter: Payer: Self-pay | Admitting: Family Medicine

## 2019-07-12 ENCOUNTER — Ambulatory Visit (INDEPENDENT_AMBULATORY_CARE_PROVIDER_SITE_OTHER): Payer: Managed Care, Other (non HMO) | Admitting: Family Medicine

## 2019-07-12 VITALS — BP 120/76 | HR 86 | Temp 98.0°F | Ht 71.5 in | Wt 208.6 lb

## 2019-07-12 DIAGNOSIS — R740 Nonspecific elevation of levels of transaminase and lactic acid dehydrogenase [LDH]: Secondary | ICD-10-CM

## 2019-07-12 DIAGNOSIS — E785 Hyperlipidemia, unspecified: Secondary | ICD-10-CM

## 2019-07-12 DIAGNOSIS — R7401 Elevation of levels of liver transaminase levels: Secondary | ICD-10-CM

## 2019-07-12 DIAGNOSIS — Z0000 Encounter for general adult medical examination without abnormal findings: Secondary | ICD-10-CM | POA: Diagnosis not present

## 2019-07-12 NOTE — Progress Notes (Signed)
This visit was conducted in person.  BP 120/76 (BP Location: Left Arm, Patient Position: Sitting, Cuff Size: Normal)   Pulse 86   Temp 98 F (36.7 C) (Temporal)   Ht 5' 11.5" (1.816 m)   Wt 208 lb 9 oz (94.6 kg)   SpO2 97%   BMI 28.68 kg/m    CC: CPE Subjective:    Patient ID: Jorge Simpson, male    DOB: 04/05/1986, 33 y.o.   MRN: [id number]  HPI: Jorge Simpson is a 33 y.o. 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   Preventative: Does not receive flu shot Tdap - 05/2017 Seat belt use discussed Sunscreen use discussed. No changing moles on skin.  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 Interim medical history since our last visit reviewed. Allergies and medications reviewed and updated. Outpatient Medications Prior to Visit  Medication Sig Dispense Refill  . Multiple Vitamins-Minerals (MULTIVITAMIN & MINERAL PO) Take 1 tablet by mouth daily.    . naproxen (NAPROSYN) 500 MG tablet Take one po bid x 1 week then prn pain, take with food 40 tablet 0  . tamoxifen (NOLVADEX) 20 MG tablet TAKE 1 TABLET BY MOUTH EVERY DAY 30 tablet 2   No facility-administered medications prior to visit.      Per HPI unless specifically indicated in ROS section below Review of Systems  Constitutional: Negative for activity change, appetite change, chills, fatigue, fever and unexpected weight change.  HENT: Negative for hearing loss.   Eyes: Negative for visual  disturbance.  Respiratory: Negative for cough, chest tightness, shortness of breath and wheezing.   Cardiovascular: Negative for chest pain, palpitations and leg swelling.  Gastrointestinal: Negative for abdominal distention, abdominal pain, blood in stool, constipation, diarrhea, nausea and vomiting.  Genitourinary: Negative for difficulty urinating and hematuria.  Musculoskeletal: Negative for arthralgias, myalgias and neck pain.  Skin: Negative for rash.  Neurological: Negative for dizziness, seizures, syncope and headaches.  Hematological: Negative for adenopathy. Does not bruise/bleed easily.  Psychiatric/Behavioral: Negative for dysphoric mood. The patient is not nervous/anxious.    Objective:    BP 120/76 (BP Location: Left Arm, Patient Position: Sitting, Cuff Size: Normal)   Pulse 86   Temp 98 F (36.7 C) (Temporal)   Ht 5' 11.5" (1.816 m)   Wt 208 lb 9 oz (94.6 kg)   SpO2 97%   BMI 28.68 kg/m   Wt Readings from Last 3 Encounters:  07/12/19 208 lb 9 oz (94.6 kg)  02/24/19 210 lb 3 oz (95.3 kg)  05/26/17 216 lb (98 kg)    Physical Exam Vitals signs and nursing note reviewed.  Constitutional:      General: He is not in acute distress.    Appearance: Normal appearance. He is well-developed. He is not ill-appearing.  HENT:     Head: Normocephalic and atraumatic.     Right Ear: Hearing, tympanic membrane, ear canal and external ear normal.     Left Ear: Hearing, tympanic  membrane, ear canal and external ear normal.     Nose: Nose normal.     Mouth/Throat:     Mouth: Mucous membranes are moist.     Pharynx: Uvula midline. No oropharyngeal exudate or posterior oropharyngeal erythema.  Eyes:     General: No scleral icterus.    Extraocular Movements: Extraocular movements intact.     Conjunctiva/sclera: Conjunctivae normal.     Pupils: Pupils are equal, round, and reactive to light.  Neck:     Musculoskeletal: Normal range of motion and neck supple.  Cardiovascular:      Rate and Rhythm: Normal rate and regular rhythm.     Pulses: Normal pulses.          Radial pulses are 2+ on the right side and 2+ on the left side.     Heart sounds: Normal heart sounds. No murmur.  Pulmonary:     Effort: Pulmonary effort is normal. No respiratory distress.     Breath sounds: Normal breath sounds. No wheezing, rhonchi or rales.  Abdominal:     General: Abdomen is flat. Bowel sounds are normal. There is no distension.     Palpations: Abdomen is soft. There is no mass.     Tenderness: There is no abdominal tenderness. There is no guarding or rebound.     Hernia: No hernia is present.  Musculoskeletal: Normal range of motion.  Lymphadenopathy:     Cervical: No cervical adenopathy.  Skin:    General: Skin is warm and dry.     Findings: No rash.  Neurological:     General: No focal deficit present.     Mental Status: He is alert and oriented to person, place, and time.     Comments: CN grossly intact, station and gait intact  Psychiatric:        Mood and Affect: Mood normal.        Behavior: Behavior normal.        Thought Content: Thought content normal.        Judgment: Judgment normal.       Results for orders placed or performed in visit on 07/08/19  TSH  Result Value Ref Range   TSH 1.10 0.35 - 4.50 uIU/mL  Comprehensive metabolic panel  Result Value Ref Range   Sodium 141 135 - 145 mEq/L   Potassium 4.0 3.5 - 5.1 mEq/L   Chloride 106 96 - 112 mEq/L   CO2 28 19 - 32 mEq/L   Glucose, Bld 88 70 - 99 mg/dL   BUN 12 6 - 23 mg/dL   Creatinine, Ser 4.091.29 0.40 - 1.50 mg/dL   Total Bilirubin 0.6 0.2 - 1.2 mg/dL   Alkaline Phosphatase 52 39 - 117 U/L   AST 21 0 - 37 U/L   ALT 19 0 - 53 U/L   Total Protein 7.0 6.0 - 8.3 g/dL   Albumin 4.6 3.5 - 5.2 g/dL   Calcium 9.4 8.4 - 81.110.5 mg/dL   GFR 91.4776.63 >82.95>60.00 mL/min  Lipid panel  Result Value Ref Range   Cholesterol 194 0 - 200 mg/dL   Triglycerides 62.179.0 0.0 - 149.0 mg/dL   HDL 30.8636.30 (L) >57.84>39.00 mg/dL   VLDL  69.615.8 0.0 - 29.540.0 mg/dL   LDL Cholesterol 284142 (H) 0 - 99 mg/dL   Total CHOL/HDL Ratio 5    NonHDL 157.76    Assessment & Plan:   Problem List Items Addressed This Visit    Transaminitis    Levels have normalized.  HLD (hyperlipidemia)    Discussed low chol diet to keep levels under control.       Health maintenance examination    Preventative protocols reviewed and updated unless pt declined. Discussed healthy diet and lifestyle.  Strong fmhx cancers. Consider PSA age 35.          No orders of the defined types were placed in this encounter.  No orders of the defined types were placed in this encounter.   Follow up plan: Return in about 2 years (around 07/11/2021) for annual exam, prior fasting for blood work.  Eustaquio BoydenJavier Agnes Brightbill, MD

## 2019-07-12 NOTE — Assessment & Plan Note (Signed)
Levels have normalized.  

## 2019-07-12 NOTE — Assessment & Plan Note (Addendum)
Preventative protocols reviewed and updated unless pt declined. Discussed healthy diet and lifestyle.  Strong fmhx cancers. Consider PSA age 35.

## 2019-07-12 NOTE — Assessment & Plan Note (Signed)
Discussed low chol diet to keep levels under control.

## 2019-07-12 NOTE — Patient Instructions (Signed)
You are doing well today Continue working on healthy diet and lifestyle. Return as needed or in 2 years for next physical.   Health Maintenance, Male Adopting a healthy lifestyle and getting preventive care are important in promoting health and wellness. Ask your health care provider about:  The right schedule for you to have regular tests and exams.  Things you can do on your own to prevent diseases and keep yourself healthy. What should I know about diet, weight, and exercise? Eat a healthy diet   Eat a diet that includes plenty of vegetables, fruits, low-fat dairy products, and lean protein.  Do not eat a lot of foods that are high in solid fats, added sugars, or sodium. Maintain a healthy weight Body mass index (BMI) is a measurement that can be used to identify possible weight problems. It estimates body fat based on height and weight. Your health care provider can help determine your BMI and help you achieve or maintain a healthy weight. Get regular exercise Get regular exercise. This is one of the most important things you can do for your health. Most adults should:  Exercise for at least 150 minutes each week. The exercise should increase your heart rate and make you sweat (moderate-intensity exercise).  Do strengthening exercises at least twice a week. This is in addition to the moderate-intensity exercise.  Spend less time sitting. Even light physical activity can be beneficial. Watch cholesterol and blood lipids Have your blood tested for lipids and cholesterol at 35 years of age, then have this test every 5 years. You may need to have your cholesterol levels checked more often if:  Your lipid or cholesterol levels are high.  You are older than 35 years of age.  You are at high risk for heart disease. What should I know about cancer screening? Many types of cancers can be detected early and may often be prevented. Depending on your health history and family history,  you may need to have cancer screening at various ages. This may include screening for:  Colorectal cancer.  Prostate cancer.  Skin cancer.  Lung cancer. What should I know about heart disease, diabetes, and high blood pressure? Blood pressure and heart disease  High blood pressure causes heart disease and increases the risk of stroke. This is more likely to develop in people who have high blood pressure readings, are of African descent, or are overweight.  Talk with your health care provider about your target blood pressure readings.  Have your blood pressure checked: ? Every 3-5 years if you are 27-39 years of age. ? Every year if you are 94 years old or older.  If you are between the ages of 71 and 45 and are a current or former smoker, ask your health care provider if you should have a one-time screening for abdominal aortic aneurysm (AAA). Diabetes Have regular diabetes screenings. This checks your fasting blood sugar level. Have the screening done:  Once every three years after age 86 if you are at a normal weight and have a low risk for diabetes.  More often and at a younger age if you are overweight or have a high risk for diabetes. What should I know about preventing infection? Hepatitis B If you have a higher risk for hepatitis B, you should be screened for this virus. Talk with your health care provider to find out if you are at risk for hepatitis B infection. Hepatitis C Blood testing is recommended for:  Everyone born  from 1945 through 1965.  Anyone with known risk factors for hepatitis C. Sexually transmitted infections (STIs)  You should be screened each year for STIs, including gonorrhea and chlamydia, if: ? You are sexually active and are younger than 35 years of age. ? You are older than 35 years of age and your health care provider tells you that you are at risk for this type of infection. ? Your sexual activity has changed since you were last screened, and  you are at increased risk for chlamydia or gonorrhea. Ask your health care provider if you are at risk.  Ask your health care provider about whether you are at high risk for HIV. Your health care provider may recommend a prescription medicine to help prevent HIV infection. If you choose to take medicine to prevent HIV, you should first get tested for HIV. You should then be tested every 3 months for as long as you are taking the medicine. Follow these instructions at home: Lifestyle  Do not use any products that contain nicotine or tobacco, such as cigarettes, e-cigarettes, and chewing tobacco. If you need help quitting, ask your health care provider.  Do not use street drugs.  Do not share needles.  Ask your health care provider for help if you need support or information about quitting drugs. Alcohol use  Do not drink alcohol if your health care provider tells you not to drink.  If you drink alcohol: ? Limit how much you have to 0-2 drinks a day. ? Be aware of how much alcohol is in your drink. In the [place] one drink equals one 12 oz bottle of beer (355 mL), one 5 oz glass of wine (148 mL), or one 1 oz glass of hard liquor (44 mL). General instructions  Schedule regular health, dental, and eye exams.  Stay current with your vaccines.  Tell your health care provider if: ? You often feel depressed. ? You have ever been abused or do not feel safe at home. Summary  Adopting a healthy lifestyle and getting preventive care are important in promoting health and wellness.  Follow your health care provider's instructions about healthy diet, exercising, and getting tested or screened for diseases.  Follow your health care provider's instructions on monitoring your cholesterol and blood pressure. This information is not intended to replace advice given to you by your health care provider. Make sure you discuss any questions you have with your health care provider. Document Released:  05/29/2008 Document Revised: 11/24/2018 Document Reviewed: 11/24/2018 Elsevier Patient Education  2020 [employer]

## 2020-02-18 ENCOUNTER — Ambulatory Visit: Payer: Managed Care, Other (non HMO) | Attending: Internal Medicine

## 2020-02-18 DIAGNOSIS — Z23 Encounter for immunization: Secondary | ICD-10-CM | POA: Insufficient documentation

## 2020-02-18 NOTE — Progress Notes (Signed)
   Covid-19 Vaccination Clinic  Name:  Jorge Simpson    MRN: [id number] DOB: [birth year]  02/18/2020  [name] was observed post Covid-19 immunization for 15 minutes without incident. He was provided with Vaccine Information Sheet and instruction to access the V-Safe system.   [name] was instructed to call 911 with any severe reactions post vaccine: Marland Kitchen Difficulty breathing  . Swelling of face and throat  . A fast heartbeat  . A bad rash all over body  . Dizziness and weakness   Immunizations Administered    Name Date Dose VIS Date Route   Pfizer COVID-19 Vaccine 02/18/2020  6:34 PM 0.3 mL 11/25/2019 Intramuscular   Manufacturer: [employer], [employer]   Lot: [id number]   NDC: [address]-3

## 2020-03-10 ENCOUNTER — Ambulatory Visit: Payer: Managed Care, Other (non HMO)

## 2020-03-21 ENCOUNTER — Ambulatory Visit: Payer: Managed Care, Other (non HMO)

## 2020-03-21 ENCOUNTER — Ambulatory Visit: Payer: Managed Care, Other (non HMO) | Attending: Internal Medicine

## 2020-03-21 DIAGNOSIS — Z23 Encounter for immunization: Secondary | ICD-10-CM

## 2020-03-21 NOTE — Progress Notes (Signed)
   Covid-19 Vaccination Clinic  Name:  Endre Coutts    MRN: [id number] DOB: Jan 18, 1986  03/21/2020  Mr. Lounsbury was observed post Covid-19 immunization for 15 minutes without incident. He was provided with Vaccine Information Sheet and instruction to access the V-Safe system.   Mr. Currier was instructed to call 911 with any severe reactions post vaccine: Marland Kitchen Difficulty breathing  . Swelling of face and throat  . A fast heartbeat  . A bad rash all over body  . Dizziness and weakness   Immunizations Administered    Name Date Dose VIS Date Route   Pfizer COVID-19 Vaccine 03/21/2020  9:02 AM 0.3 mL 11/25/2019 Intramuscular   Manufacturer: [employer], [employer]   Lot: [id number]   NDC: [address]-8    2

## 2020-03-29 ENCOUNTER — Other Ambulatory Visit: Payer: Self-pay

## 2020-03-30 ENCOUNTER — Encounter: Payer: Self-pay | Admitting: Family Medicine

## 2020-03-30 ENCOUNTER — Ambulatory Visit (INDEPENDENT_AMBULATORY_CARE_PROVIDER_SITE_OTHER): Payer: 59 | Admitting: Family Medicine

## 2020-03-30 VITALS — BP 122/86 | HR 82 | Temp 97.8°F | Ht 71.5 in | Wt 208.4 lb

## 2020-03-30 DIAGNOSIS — R7401 Elevation of levels of liver transaminase levels: Secondary | ICD-10-CM | POA: Diagnosis not present

## 2020-03-30 DIAGNOSIS — Z0000 Encounter for general adult medical examination without abnormal findings: Secondary | ICD-10-CM

## 2020-03-30 DIAGNOSIS — E785 Hyperlipidemia, unspecified: Secondary | ICD-10-CM

## 2020-03-30 NOTE — Patient Instructions (Signed)
Good to see you today.  Labs today. Continue healthy diet and lifestyle.  Return as needed or in 1 year for next physical.   Health Maintenance, Male Adopting a healthy lifestyle and getting preventive care are important in promoting health and wellness. Ask your health care provider about:  The right schedule for you to have regular tests and exams.  Things you can do on your own to prevent diseases and keep yourself healthy. What should I know about diet, weight, and exercise? Eat a healthy diet   Eat a diet that includes plenty of vegetables, fruits, low-fat dairy products, and lean protein.  Do not eat a lot of foods that are high in solid fats, added sugars, or sodium. Maintain a healthy weight Body mass index (BMI) is a measurement that can be used to identify possible weight problems. It estimates body fat based on height and weight. Your health care provider can help determine your BMI and help you achieve or maintain a healthy weight. Get regular exercise Get regular exercise. This is one of the most important things you can do for your health. Most adults should:  Exercise for at least 150 minutes each week. The exercise should increase your heart rate and make you sweat (moderate-intensity exercise).  Do strengthening exercises at least twice a week. This is in addition to the moderate-intensity exercise.  Spend less time sitting. Even light physical activity can be beneficial. Watch cholesterol and blood lipids Have your blood tested for lipids and cholesterol at 36 years of age, then have this test every 5 years. You may need to have your cholesterol levels checked more often if:  Your lipid or cholesterol levels are high.  You are older than 36 years of age.  You are at high risk for heart disease. What should I know about cancer screening? Many types of cancers can be detected early and may often be prevented. Depending on your health history and family history,  you may need to have cancer screening at various ages. This may include screening for:  Colorectal cancer.  Prostate cancer.  Skin cancer.  Lung cancer. What should I know about heart disease, diabetes, and high blood pressure? Blood pressure and heart disease  High blood pressure causes heart disease and increases the risk of stroke. This is more likely to develop in people who have high blood pressure readings, are of African descent, or are overweight.  Talk with your health care provider about your target blood pressure readings.  Have your blood pressure checked: ? Every 3-5 years if you are 64-72 years of age. ? Every year if you are 80 years old or older.  If you are between the ages of 38 and 35 and are a current or former smoker, ask your health care provider if you should have a one-time screening for abdominal aortic aneurysm (AAA). Diabetes Have regular diabetes screenings. This checks your fasting blood sugar level. Have the screening done:  Once every three years after age 61 if you are at a normal weight and have a low risk for diabetes.  More often and at a younger age if you are overweight or have a high risk for diabetes. What should I know about preventing infection? Hepatitis B If you have a higher risk for hepatitis B, you should be screened for this virus. Talk with your health care provider to find out if you are at risk for hepatitis B infection. Hepatitis C Blood testing is recommended for:  Everyone born from [birth year] through 1967.  Anyone with known risk factors for hepatitis C. Sexually transmitted infections (STIs)  You should be screened each year for STIs, including gonorrhea and chlamydia, if: ? You are sexually active and are younger than 36 years of age. ? You are older than 36 years of age and your health care provider tells you that you are at risk for this type of infection. ? Your sexual activity has changed since you were last screened, and  you are at increased risk for chlamydia or gonorrhea. Ask your health care provider if you are at risk.  Ask your health care provider about whether you are at high risk for HIV. Your health care provider may recommend a prescription medicine to help prevent HIV infection. If you choose to take medicine to prevent HIV, you should first get tested for HIV. You should then be tested every 3 months for as long as you are taking the medicine. Follow these instructions at home: Lifestyle  Do not use any products that contain nicotine or tobacco, such as cigarettes, e-cigarettes, and chewing tobacco. If you need help quitting, ask your health care provider.  Do not use street drugs.  Do not share needles.  Ask your health care provider for help if you need support or information about quitting drugs. Alcohol use  Do not drink alcohol if your health care provider tells you not to drink.  If you drink alcohol: ? Limit how much you have to 0-2 drinks a day. ? Be aware of how much alcohol is in your drink. In the [place] one drink equals one 12 oz bottle of beer (355 mL), one 5 oz glass of wine (148 mL), or one 1 oz glass of hard liquor (44 mL). General instructions  Schedule regular health, dental, and eye exams.  Stay current with your vaccines.  Tell your health care provider if: ? You often feel depressed. ? You have ever been abused or do not feel safe at home. Summary  Adopting a healthy lifestyle and getting preventive care are important in promoting health and wellness.  Follow your health care provider's instructions about healthy diet, exercising, and getting tested or screened for diseases.  Follow your health care provider's instructions on monitoring your cholesterol and blood pressure. This information is not intended to replace advice given to you by your health care provider. Make sure you discuss any questions you have with your health care provider. Document Revised:  11/24/2018 Document Reviewed: 11/24/2018 Elsevier Patient Education  2020 [employer].

## 2020-03-30 NOTE — Assessment & Plan Note (Signed)
Levels largely normalized.

## 2020-03-30 NOTE — Assessment & Plan Note (Signed)
Preventative protocols reviewed and updated unless pt declined. Discussed healthy diet and lifestyle.  

## 2020-03-30 NOTE — Assessment & Plan Note (Signed)
Update FLP.  

## 2020-03-30 NOTE — Progress Notes (Signed)
This visit was conducted in person.  BP 122/86 (BP Location: Left Arm, Patient Position: Sitting, Cuff Size: Normal)   Pulse 82   Temp 97.8 F (36.6 C) (Temporal)   Ht 5' 11.5" (1.816 m)   Wt 208 lb 7 oz (94.5 kg)   SpO2 97%   BMI 28.67 kg/m    CC: CPE Subjective:    Patient ID: Jorge Simpson, male    DOB: Nov 16, 1986, 34 y.o.   MRN: [id number]  HPI: Jorge Simpson is a 34 y.o. male presenting on 03/30/2020 for Annual Exam   Preventative: Does not receiveflu shot Tdap - 05/2017 COVID vaccine - completed Pfizer 03/2020  Seat belt use discussed Sunscreen use discussed. No changing moles on skin. 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 Interim medical history since our last visit reviewed. Allergies and medications reviewed and updated. Outpatient Medications Prior to Visit  Medication Sig Dispense Refill  . Multiple Vitamins-Minerals (MULTIVITAMIN & MINERAL PO) Take 1 tablet by mouth daily.    . naproxen (NAPROSYN) 500 MG tablet Take one po bid x 1 week then prn pain, take with food 40 tablet 0   No facility-administered medications prior to visit.     Per HPI unless specifically indicated in ROS section below Review of Systems  Constitutional: Negative for activity change, appetite change, chills, fatigue, fever and unexpected weight change.  HENT: Negative for hearing loss.   Eyes: Negative for visual disturbance.  Respiratory: Negative for cough, chest tightness, shortness of breath and wheezing.   Cardiovascular: Negative for chest pain, palpitations and leg swelling.  Gastrointestinal: Negative for abdominal distention,  abdominal pain, blood in stool, constipation, diarrhea, nausea and vomiting.  Genitourinary: Negative for difficulty urinating and hematuria.  Musculoskeletal: Negative for arthralgias, myalgias and neck pain.  Skin: Negative for rash.  Neurological: Negative for dizziness, seizures, syncope and headaches.  Hematological: Negative for adenopathy. Does not bruise/bleed easily.  Psychiatric/Behavioral: Negative for dysphoric mood. The patient is not nervous/anxious.    Objective:    BP 122/86 (BP Location: Left Arm, Patient Position: Sitting, Cuff Size: Normal)   Pulse 82   Temp 97.8 F (36.6 C) (Temporal)   Ht 5' 11.5" (1.816 m)   Wt 208 lb 7 oz (94.5 kg)   SpO2 97%   BMI 28.67 kg/m   Wt Readings from Last 3 Encounters:  03/30/20 208 lb 7 oz (94.5 kg)  07/12/19 208 lb 9 oz (94.6 kg)  02/24/19 210 lb 3 oz (95.3 kg)    Physical Exam Vitals and nursing note reviewed.  Constitutional:      General: He is not in acute distress.    Appearance: Normal appearance. He is well-developed. He is not ill-appearing.  HENT:     Head: Normocephalic and atraumatic.     Right Ear: Hearing, tympanic membrane, ear canal and external ear normal.     Left Ear: Hearing, tympanic membrane, ear canal and external ear normal.  Eyes:     General: No scleral icterus.    Extraocular Movements: Extraocular movements intact.     Conjunctiva/sclera: Conjunctivae normal.     Pupils: Pupils are equal, round, and reactive to light.  Cardiovascular:     Rate and Rhythm: Normal rate and regular rhythm.     Pulses: Normal pulses.          Radial pulses are 2+ on the right side and 2+ on the left side.     Heart sounds: Normal heart sounds. No murmur.  Pulmonary:     Effort: Pulmonary effort is normal. No respiratory distress.     Breath sounds: Normal breath sounds. No wheezing, rhonchi or rales.  Abdominal:     General: Abdomen is flat. Bowel sounds are normal. There is no distension.     Palpations:  Abdomen is soft. There is no mass.     Tenderness: There is no abdominal tenderness. There is no guarding or rebound.     Hernia: No hernia is present.  Musculoskeletal:        General: Normal range of motion.     Cervical back: Normal range of motion and neck supple.     Right lower leg: No edema.     Left lower leg: No edema.  Lymphadenopathy:     Cervical: No cervical adenopathy.  Skin:    General: Skin is warm and dry.     Findings: No rash.  Neurological:     General: No focal deficit present.     Mental Status: He is alert and oriented to person, place, and time.     Comments: CN grossly intact, station and gait intact  Psychiatric:        Mood and Affect: Mood normal.        Behavior: Behavior normal.        Thought Content: Thought content normal.        Judgment: Judgment normal.       Results for orders placed or performed in visit on 07/08/19  TSH  Result Value Ref Range   TSH 1.10 0.35 - 4.50 uIU/mL  Comprehensive metabolic panel  Result Value Ref Range   Sodium 141 135 - 145 mEq/L   Potassium 4.0 3.5 - 5.1 mEq/L   Chloride 106 96 - 112 mEq/L   CO2 28 19 - 32 mEq/L   Glucose, Bld 88 70 - 99 mg/dL   BUN 12 6 - 23 mg/dL   Creatinine, Ser 1.29 0.40 - 1.50 mg/dL   Total Bilirubin 0.6 0.2 - 1.2 mg/dL   Alkaline Phosphatase 52 39 - 117 U/L   AST 21 0 - 37 U/L   ALT 19 0 - 53 U/L   Total Protein 7.0 6.0 - 8.3 g/dL   Albumin 4.6 3.5 - 5.2 g/dL   Calcium 9.4 8.4 - 10.5 mg/dL   GFR 76.63 >60.00 mL/min  Lipid panel  Result Value Ref Range   Cholesterol 194 0 - 200 mg/dL   Triglycerides 79.0 0.0 - 149.0 mg/dL   HDL 36.30 (L) >39.00 mg/dL   VLDL 15.8 0.0 - 40.0 mg/dL   LDL Cholesterol 142 (H) 0 - 99 mg/dL   Total CHOL/HDL Ratio 5    NonHDL 157.76    Assessment & Plan:  This visit occurred during the SARS-CoV-2 public health emergency.  Safety protocols were in place, including screening questions prior to the visit, additional usage of staff PPE, and extensive  cleaning of exam room while observing appropriate contact time as indicated for disinfecting solutions.   Problem List Items Addressed This Visit    Transaminitis    Levels largely normalized.       HLD (hyperlipidemia)    Update  FLP.       Relevant Orders   Lipid panel   Comprehensive metabolic panel   Health maintenance examination - Primary    Preventative protocols reviewed and updated unless pt declined. Discussed healthy diet and lifestyle.           No orders of the defined types were placed in this encounter.  Orders Placed This Encounter  Procedures  . Lipid panel  . Comprehensive metabolic panel    Patient instructions: Good to see you today.  Labs today. Continue healthy diet and lifestyle.  Return as needed or in 1 year for next physical.   Follow up plan: Return in about 1 year (around 03/30/2021) for annual exam, prior fasting for blood work.  Eustaquio Boyden, MD

## 2020-03-31 LAB — LIPID PANEL
Cholesterol: 199 mg/dL (ref <200)
HDL: 30 mg/dL — ABNORMAL LOW (ref >=40)
LDL Cholesterol (Calc): 150 mg/dL (calc) — ABNORMAL HIGH
Non-HDL Cholesterol (Calc): 169 mg/dL (calc) — ABNORMAL HIGH (ref <130)
Total CHOL/HDL Ratio: 6.6 (calc) — ABNORMAL HIGH (ref <5.0)
Triglycerides: 83 mg/dL (ref <150)

## 2020-03-31 LAB — COMPREHENSIVE METABOLIC PANEL
AG Ratio: 1.6 (calc) (ref 1.0–2.5)
ALT: 39 U/L (ref 9–46)
AST: 42 U/L — ABNORMAL HIGH (ref 10–40)
Albumin: 4.4 g/dL (ref 3.6–5.1)
Alkaline phosphatase (APISO): 42 U/L (ref 36–130)
BUN/Creatinine Ratio: 10 (calc) (ref 6–22)
BUN: 15 mg/dL (ref 7–25)
CO2: 26 mmol/L (ref 20–32)
Calcium: 9.6 mg/dL (ref 8.6–10.3)
Chloride: 104 mmol/L (ref 98–110)
Creat: 1.44 mg/dL — ABNORMAL HIGH (ref 0.60–1.35)
Globulin: 2.7 g/dL (calc) (ref 1.9–3.7)
Glucose, Bld: 87 mg/dL (ref 65–99)
Potassium: 4.3 mmol/L (ref 3.5–5.3)
Sodium: 140 mmol/L (ref 135–146)
Total Bilirubin: 0.5 mg/dL (ref 0.2–1.2)
Total Protein: 7.1 g/dL (ref 6.1–8.1)

## 2020-07-13 ENCOUNTER — Encounter: Payer: Managed Care, Other (non HMO) | Admitting: Family Medicine

## 2021-06-28 ENCOUNTER — Other Ambulatory Visit: Payer: 59

## 2021-06-30 ENCOUNTER — Other Ambulatory Visit: Payer: Self-pay | Admitting: Family Medicine

## 2021-06-30 DIAGNOSIS — E785 Hyperlipidemia, unspecified: Secondary | ICD-10-CM

## 2021-07-01 ENCOUNTER — Other Ambulatory Visit: Payer: Self-pay

## 2021-07-01 ENCOUNTER — Other Ambulatory Visit (INDEPENDENT_AMBULATORY_CARE_PROVIDER_SITE_OTHER): Payer: Self-pay

## 2021-07-01 DIAGNOSIS — E785 Hyperlipidemia, unspecified: Secondary | ICD-10-CM

## 2021-07-01 LAB — COMPREHENSIVE METABOLIC PANEL
ALT: 36 U/L (ref 0–53)
AST: 51 U/L — ABNORMAL HIGH (ref 0–37)
Albumin: 4.1 g/dL (ref 3.5–5.2)
Alkaline Phosphatase: 37 U/L — ABNORMAL LOW (ref 39–117)
BUN: 10 mg/dL (ref 6–23)
CO2: 25 mEq/L (ref 19–32)
Calcium: 9 mg/dL (ref 8.4–10.5)
Chloride: 104 mEq/L (ref 96–112)
Creatinine, Ser: 1.35 mg/dL (ref 0.40–1.50)
GFR: 67.25 mL/min (ref >60.00)
Glucose, Bld: 99 mg/dL (ref 70–99)
Potassium: 3.7 mEq/L (ref 3.5–5.1)
Sodium: 139 mEq/L (ref 135–145)
Total Bilirubin: 0.8 mg/dL (ref 0.2–1.2)
Total Protein: 7.1 g/dL (ref 6.0–8.3)

## 2021-07-01 LAB — LIPID PANEL
Cholesterol: 243 mg/dL — ABNORMAL HIGH (ref 0–200)
HDL: 21.6 mg/dL — ABNORMAL LOW (ref >39.00)
LDL Cholesterol: 201 mg/dL — ABNORMAL HIGH (ref 0–99)
NonHDL: 221.19
Total CHOL/HDL Ratio: 11
Triglycerides: 103 mg/dL (ref 0.0–149.0)
VLDL: 20.6 mg/dL (ref 0.0–40.0)

## 2021-07-05 ENCOUNTER — Encounter: Payer: Self-pay | Admitting: Family Medicine

## 2021-07-05 ENCOUNTER — Ambulatory Visit (INDEPENDENT_AMBULATORY_CARE_PROVIDER_SITE_OTHER): Payer: Managed Care, Other (non HMO) | Admitting: Family Medicine

## 2021-07-05 ENCOUNTER — Other Ambulatory Visit: Payer: Self-pay

## 2021-07-05 VITALS — BP 116/70 | HR 70 | Temp 97.9°F | Ht 71.75 in | Wt 219.2 lb

## 2021-07-05 DIAGNOSIS — R7401 Elevation of levels of liver transaminase levels: Secondary | ICD-10-CM | POA: Diagnosis not present

## 2021-07-05 DIAGNOSIS — Z0000 Encounter for general adult medical examination without abnormal findings: Secondary | ICD-10-CM | POA: Diagnosis not present

## 2021-07-05 DIAGNOSIS — E782 Mixed hyperlipidemia: Secondary | ICD-10-CM | POA: Diagnosis not present

## 2021-07-05 MED ORDER — ATORVASTATIN CALCIUM 40 MG PO TABS: 40.0000 mg | ORAL_TABLET | Freq: Every day | ORAL | 3 refills | Status: DC

## 2021-07-05 NOTE — Assessment & Plan Note (Addendum)
Chronic, LDL markedly elevated >190. rec start statin, he agrees. Start atorvastatin 40mg  daily. He will monitor for myalgias. Reassess control at 3-4 mo lab visit.  Discussed Lipoprotein A level however wouldn't change recommendation given markedly elevated LDL The ASCVD Risk score DC Jr., et al., 2013) failed to calculate for the following reasons:   The 2013 ASCVD risk score is only valid for ages 44 to 24

## 2021-07-05 NOTE — Assessment & Plan Note (Addendum)
Recurrent - possibly related to weight gain ?fatty liver. Will continue to monitor.  Normal abd [place] 2012

## 2021-07-05 NOTE — Progress Notes (Signed)
Patient ID: Jorge Simpson, male    DOB: 29-Aug-1986, 35 y.o.   MRN: [id number]  This visit was conducted in person.  BP 116/70   Pulse 70   Temp 97.9 F (36.6 C) (Temporal)   Ht 5' 11.75" (1.822 m)   Wt 219 lb 3 oz (99.4 kg)   SpO2 98%   BMI 29.93 kg/m    CC: CPE  Subjective:   HPI: Jorge Simpson is a 35 y.o. male presenting on 07/05/2021 for Annual Exam   Some work and home related stressors. Increased COVID stressors - oldest 8yo child had COVID 2021 complicated by MIS-C - has has since recovered well.  PHQ9 = 8.   Fmhx HLD - but no CAD or CVA besides paternal grandfather at age 27s.   Preventative: Does not receive flu shot COVID vaccine - Pfizer 02/2020, 03/2020, booster 10/2020  Tdap - 05/2017 Seat belt use discussed Sunscreen use discussed. No changing moles on skin.  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 Interim medical history since our last visit reviewed. Allergies and medications reviewed and updated. Outpatient Medications Prior to Visit  Medication Sig Dispense Refill   Multiple Vitamins-Minerals (MULTIVITAMIN & MINERAL PO) Take 1 tablet by mouth daily.     naproxen (NAPROSYN) 500 MG tablet Take one po bid x 1 week then prn pain, take with food 40 tablet 0   No facility-administered medications prior to visit.     Per HPI unless specifically indicated in ROS section below Review of Systems  Constitutional:  Negative for activity change, appetite change, chills, fatigue, fever and unexpected weight change.  HENT:  Negative for hearing loss.   Eyes:  Negative for visual disturbance.   Respiratory:  Negative for cough, chest tightness, shortness of breath and wheezing.   Cardiovascular:  Positive for palpitations (skipped beats twice weekly). Negative for chest pain and leg swelling.  Gastrointestinal:  Negative for abdominal distention, abdominal pain, blood in stool, constipation, diarrhea, nausea and vomiting.  Genitourinary:  Negative for difficulty urinating and hematuria.  Musculoskeletal:  Negative for arthralgias, myalgias and neck pain.  Skin:  Negative for rash.  Neurological:  Negative for dizziness, seizures, syncope and headaches.  Hematological:  Negative for adenopathy. Does not bruise/bleed easily.  Psychiatric/Behavioral:  Negative for dysphoric mood. The patient is not nervous/anxious.    Objective:  BP 116/70   Pulse 70   Temp 97.9 F (36.6 C) (Temporal)   Ht 5' 11.75" (1.822 m)   Wt 219 lb 3 oz (99.4 kg)   SpO2 98%   BMI 29.93 kg/m   Wt Readings from Last 3 Encounters:  07/05/21 219 lb 3 oz (99.4 kg)  03/30/20 208 lb 7 oz (94.5 kg)  07/12/19 208 lb 9 oz (94.6 kg)      Physical Exam Vitals and nursing note reviewed.  Constitutional:      General: He is not in acute distress.    Appearance: Normal appearance. He is well-developed. He is not ill-appearing.  HENT:     Head: Normocephalic and atraumatic.     Right Ear: Hearing, tympanic membrane, ear canal  and external ear normal.     Left Ear: Hearing, tympanic membrane, ear canal and external ear normal.  Eyes:     General: No scleral icterus.    Extraocular Movements: Extraocular movements intact.     Conjunctiva/sclera: Conjunctivae normal.     Pupils: Pupils are equal, round, and reactive to light.  Neck:     Thyroid: No thyroid mass or thyromegaly.  Cardiovascular:     Rate and Rhythm: Normal rate and regular rhythm.     Pulses: Normal pulses.          Radial pulses are 2+ on the right side and 2+ on the left side.     Heart sounds: Normal heart sounds. No murmur  heard. Pulmonary:     Effort: Pulmonary effort is normal. No respiratory distress.     Breath sounds: Normal breath sounds. No wheezing, rhonchi or rales.  Abdominal:     General: Bowel sounds are normal. There is no distension.     Palpations: Abdomen is soft. There is no mass.     Tenderness: There is no abdominal tenderness. There is no guarding or rebound.     Hernia: No hernia is present.  Musculoskeletal:        General: Normal range of motion.     Cervical back: Normal range of motion and neck supple.     Right lower leg: No edema.     Left lower leg: No edema.  Lymphadenopathy:     Cervical: No cervical adenopathy.  Skin:    General: Skin is warm and dry.     Findings: No rash.  Neurological:     General: No focal deficit present.     Mental Status: He is alert and oriented to person, place, and time.  Psychiatric:        Mood and Affect: Mood normal.        Behavior: Behavior normal.        Thought Content: Thought content normal.        Judgment: Judgment normal.      Results for orders placed or performed in visit on 07/01/21  Lipid panel  Result Value Ref Range   Cholesterol 243 (H) 0 - 200 mg/dL   Triglycerides 962.2 0.0 - 149.0 mg/dL   HDL 29.79 (L) >89.21 mg/dL   VLDL 19.4 0.0 - 17.4 mg/dL   LDL Cholesterol 081 (H) 0 - 99 mg/dL   Total CHOL/HDL Ratio 11    NonHDL 221.19   Comprehensive metabolic panel  Result Value Ref Range   Sodium 139 135 - 145 mEq/L   Potassium 3.7 3.5 - 5.1 mEq/L   Chloride 104 96 - 112 mEq/L   CO2 25 19 - 32 mEq/L   Glucose, Bld 99 70 - 99 mg/dL   BUN 10 6 - 23 mg/dL   Creatinine, Ser 4.48 0.40 - 1.50 mg/dL   Total Bilirubin 0.8 0.2 - 1.2 mg/dL   Alkaline Phosphatase 37 (L) 39 - 117 U/L   AST 51 (H) 0 - 37 U/L   ALT 36 0 - 53 U/L   Total Protein 7.1 6.0 - 8.3 g/dL   Albumin 4.1 3.5 - 5.2 g/dL   GFR 18.56 >31.49 mL/min   Calcium 9.0 8.4 - 10.5 mg/dL   Depression screen [facility] 2/9 07/05/2021 03/30/2020 07/12/2019  Decreased  Interest 0 0 0  Down, Depressed, Hopeless 2 0 0  PHQ - 2 Score 2 0 0  Altered sleeping 2 - -  Tired, decreased energy 2 - -  Change in appetite 0 - -  Feeling bad or failure about yourself  2 - -  Trouble concentrating 0 - -  Moving slowly or fidgety/restless 0 - -  Suicidal thoughts 0 - -  PHQ-9 Score 8 - -    GAD 7 : Generalized Anxiety Score 07/05/2021  Nervous, Anxious, on Edge 1  Control/stop worrying 1  Worry too much - different things 0  Trouble relaxing 0  Restless 0  Easily annoyed or irritable 1  Afraid - awful might happen 0  Total GAD 7 Score 3   Assessment & Plan:  This visit occurred during the SARS-CoV-2 public health emergency.  Safety protocols were in place, including screening questions prior to the visit, additional usage of staff PPE, and extensive cleaning of exam room while observing appropriate contact time as indicated for disinfecting solutions.   Problem List Items Addressed This Visit     Transaminitis    Recurrent - possibly related to weight gain ?fatty liver. Will continue to monitor.  Normal abd [place] 2012       Relevant Orders   Comprehensive metabolic panel   TSH   Health maintenance examination - Primary    Preventative protocols reviewed and updated unless pt declined. Discussed healthy diet and lifestyle.        HLD (hyperlipidemia)    Chronic, LDL markedly elevated >190. rec start statin, he agrees. Start atorvastatin 40mg  daily. He will monitor for myalgias. Reassess control at 3-4 mo lab visit.  Discussed Lipoprotein A level however wouldn't change recommendation given markedly elevated LDL The ASCVD Risk score DC Jr., et al., 2013) failed to calculate for the following reasons:   The 2013 ASCVD risk score is only valid for ages 33 to 36        Relevant Medications   atorvastatin (LIPITOR) 40 MG tablet   Other Relevant Orders   Lipid panel   Comprehensive metabolic panel   TSH     Meds ordered this encounter   Medications   atorvastatin (LIPITOR) 40 MG tablet    Sig: Take 1 tablet (40 mg total) by mouth daily.    Dispense:  90 tablet    Refill:  3   Orders Placed This Encounter  Procedures   Lipid panel    Standing Status:   Future    Standing Expiration Date:   07/05/2022   Comprehensive metabolic panel    Standing Status:   Future    Standing Expiration Date:   07/05/2022   TSH    Standing Status:   Future    Standing Expiration Date:   07/05/2022      Patient instructions: Send 07/07/2022 date of COVID booster to update your chart.  Cholesterol levels were very elevated today - start atorvastatin 40mg  daily. Watch for muscle aches. Increase fiber in the diet to help cholesterol levels as well.  Schedule repeat labs in 3-4 months to recheck cholesterol and liver.  You are doing well today Return as needed or in 1 year for next physical.  Follow up plan: Return in about 1 year (around 07/05/2022) for annual exam, prior fasting for blood work.  , MD

## 2021-07-05 NOTE — Patient Instructions (Addendum)
Send [place] date of COVID booster to update your chart.  Cholesterol levels were very elevated today - start atorvastatin 40mg  daily. Watch for muscle aches. Increase fiber in the diet to help cholesterol levels as well.  Schedule repeat labs in 3-4 months to recheck cholesterol and liver.  You are doing well today Return as needed or in 1 year for next physical.  Health Maintenance, Male Adopting a healthy lifestyle and getting preventive care are important in promoting health and wellness. Ask your health care provider about: The right schedule for you to have regular tests and exams. Things you can do on your own to prevent diseases and keep yourself healthy. What should I know about diet, weight, and exercise? Eat a healthy diet  Eat a diet that includes plenty of vegetables, fruits, low-fat dairy products, and lean protein. Do not eat a lot of foods that are high in solid fats, added sugars, or sodium.  Maintain a healthy weight Body mass index (BMI) is a measurement that can be used to identify possible weight problems. It estimates body fat based on height and weight. Your health care provider can help determine your BMI and help you achieve or maintain ahealthy weight. Get regular exercise Get regular exercise. This is one of the most important things you can do for your health. Most adults should: Exercise for at least 150 minutes each week. The exercise should increase your heart rate and make you sweat (moderate-intensity exercise). Do strengthening exercises at least twice a week. This is in addition to the moderate-intensity exercise. Spend less time sitting. Even light physical activity can be beneficial. Watch cholesterol and blood lipids Have your blood tested for lipids and cholesterol at 37 years of age, then havethis test every 5 years. You may need to have your cholesterol levels checked more often if: Your lipid or cholesterol levels are high. You are older than 37 years  of age. You are at high risk for heart disease. What should I know about cancer screening? Many types of cancers can be detected early and may often be prevented. Depending on your health history and family history, you may need to have cancer screening at various ages. This may include screening for: Colorectal cancer. Prostate cancer. Skin cancer. Lung cancer. What should I know about heart disease, diabetes, and high blood pressure? Blood pressure and heart disease High blood pressure causes heart disease and increases the risk of stroke. This is more likely to develop in people who have high blood pressure readings, are of African descent, or are overweight. Talk with your health care provider about your target blood pressure readings. Have your blood pressure checked: Every 3-5 years if you are 86-63 years of age. Every year if you are 65 years old or older. If you are between the ages of 72 and 39 and are a current or former smoker, ask your health care provider if you should have a one-time screening for abdominal aortic aneurysm (AAA). Diabetes Have regular diabetes screenings. This checks your fasting blood sugar level. Have the screening done: Once every three years after age 62 if you are at a normal weight and have a low risk for diabetes. More often and at a younger age if you are overweight or have a high risk for diabetes. What should I know about preventing infection? Hepatitis B If you have a higher risk for hepatitis B, you should be screened for this virus. Talk with your health care provider to find out  if you are at risk forhepatitis B infection. Hepatitis C Blood testing is recommended for: Everyone born from [birth year] through 1967. Anyone with known risk factors for hepatitis C. Sexually transmitted infections (STIs) You should be screened each year for STIs, including gonorrhea and chlamydia, if: You are sexually active and are younger than 37 years of age. You are  older than 37 years of age and your health care provider tells you that you are at risk for this type of infection. Your sexual activity has changed since you were last screened, and you are at increased risk for chlamydia or gonorrhea. Ask your health care provider if you are at risk. Ask your health care provider about whether you are at high risk for HIV. Your health care provider may recommend a prescription medicine to help prevent HIV infection. If you choose to take medicine to prevent HIV, you should first get tested for HIV. You should then be tested every 3 months for as long as you are taking the medicine. Follow these instructions at home: Lifestyle Do not use any products that contain nicotine or tobacco, such as cigarettes, e-cigarettes, and chewing tobacco. If you need help quitting, ask your health care provider. Do not use street drugs. Do not share needles. Ask your health care provider for help if you need support or information about quitting drugs. Alcohol use Do not drink alcohol if your health care provider tells you not to drink. If you drink alcohol: Limit how much you have to 0-2 drinks a day. Be aware of how much alcohol is in your drink. In the [place] one drink equals one 12 oz bottle of beer (355 mL), one 5 oz glass of wine (148 mL), or one 1 oz glass of hard liquor (44 mL). General instructions Schedule regular health, dental, and eye exams. Stay current with your vaccines. Tell your health care provider if: You often feel depressed. You have ever been abused or do not feel safe at home. Summary Adopting a healthy lifestyle and getting preventive care are important in promoting health and wellness. Follow your health care provider's instructions about healthy diet, exercising, and getting tested or screened for diseases. Follow your health care provider's instructions on monitoring your cholesterol and blood pressure. This information is not intended to replace  advice given to you by your health care provider. Make sure you discuss any questions you have with your healthcare provider. Document Revised: 11/24/2018 Document Reviewed: 11/24/2018 Elsevier Patient Education  2022 [employer].

## 2021-07-05 NOTE — Assessment & Plan Note (Signed)
Preventative protocols reviewed and updated unless pt declined. Discussed healthy diet and lifestyle.  

## 2021-07-08 ENCOUNTER — Encounter: Payer: Self-pay | Admitting: Family Medicine

## 2021-07-08 NOTE — Telephone Encounter (Signed)
Updated pt's chart.  

## 2021-10-09 ENCOUNTER — Other Ambulatory Visit: Payer: Managed Care, Other (non HMO)

## 2022-06-16 ENCOUNTER — Encounter: Payer: Self-pay | Admitting: Family

## 2022-06-16 ENCOUNTER — Encounter: Payer: Self-pay | Admitting: Family Medicine

## 2022-06-16 ENCOUNTER — Ambulatory Visit (INDEPENDENT_AMBULATORY_CARE_PROVIDER_SITE_OTHER): Payer: Managed Care, Other (non HMO) | Admitting: Family

## 2022-06-16 VITALS — BP 130/88 | HR 82 | Temp 98.3°F | Resp 16 | Ht 71.75 in | Wt 221.4 lb

## 2022-06-16 DIAGNOSIS — L858 Other specified epidermal thickening: Secondary | ICD-10-CM | POA: Diagnosis not present

## 2022-06-16 DIAGNOSIS — L309 Dermatitis, unspecified: Secondary | ICD-10-CM | POA: Diagnosis not present

## 2022-06-16 MED ORDER — TRIAMCINOLONE ACETONIDE 0.1 % EX CREA
1.0000 | TOPICAL_CREAM | Freq: Two times a day (BID) | CUTANEOUS | 0 refills | Status: DC
Start: 2022-06-16 — End: 2022-11-25

## 2022-06-16 NOTE — Assessment & Plan Note (Signed)
Fragrance free soaps Aquaphor, triamcinolone cream post shower for trouble spots Zyrtec prn   Suspected heat rash after mowing, try to cut down on sweating as able. Carry towel with you when mowing.

## 2022-06-16 NOTE — Telephone Encounter (Signed)
Made appt with Mort Sawyers at 3pm today

## 2022-06-16 NOTE — Progress Notes (Signed)
Established Patient Office Visit  Subjective:  Patient ID: Jorge Simpson, male    DOB: 20-Oct-1986  Age: 36 y.o. MRN: [id number]  CC:  Chief Complaint  Patient presents with   Rash    On back X 3-4 months and inside elbow esp when mowing grass    HPI Cassian Torelli is here today with concerns.   Notices a rash on his back that is more consistent, has been there for the last four months or so- itches every other day or so. Son has eczema so used his sons triamcinolone cream with some relief.   Does notice rash on bil inner forearms after he mows the grass. Rash is more fleeting on the arms, itches when it occurs. Puts the cream on there sometimes.   Doesn't really sweat a lot.   No past medical history on file.  Past Surgical History:  Procedure Laterality Date   ANTERIOR CRUCIATE LIGAMENT REPAIR  2010    Family History  Problem Relation Age of Onset   Cancer Mother 29       breast, met to bones   Cancer Maternal Aunt        breast   Cancer Paternal Grandfather        intestinal   CAD Paternal Grandfather        MI   Cancer Paternal Grandmother        breast   Cancer Paternal Uncle        prostate   Stroke Maternal Grandmother    Hyperlipidemia Father    Hypertension Father    Cancer Maternal Uncle        colon   Diabetes Neg Hx     Social History   Socioeconomic History   Marital status: Married    Spouse name: Not on file   Number of children: Not on file   Years of education: Not on file   Highest education level: Not on file  Occupational History   Not on file  Tobacco Use   Smoking status: Never   Smokeless tobacco: Never  Substance and Sexual Activity   Alcohol use: Yes    Alcohol/week: 0.0 standard drinks of alcohol    Comment: occasionally   Drug use: No   Sexual activity: Yes    Partners: Female  Other Topics Concern   Not on file  Social History Narrative   Lives alone, 1 dog   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 90 tablet 3   Multiple Vitamins-Minerals (MULTIVITAMIN & MINERAL PO) Take 1 tablet by mouth daily.     No facility-administered medications prior to visit.    No Known Allergies      Objective:    Physical Exam Constitutional:      General: He is not in acute distress.    Appearance: Normal appearance. He is normal weight. He is not ill-appearing, toxic-appearing or  diaphoretic.  Pulmonary:     Effort: Pulmonary effort is normal.  Skin:    General: Skin is warm.     Findings: Rash (raised papules with follicular involvement left upper back with mild erythema) present.  Neurological:     Mental Status: He is alert.     BP 130/88   Pulse 82   Temp 98.3 F (36.8 C)   Resp 16   Ht 5' 11.75" (1.822 m)   Wt 221 lb 6 oz (100.4 kg)   SpO2 96%   BMI 30.23 kg/m  Wt Readings from Last 3 Encounters:  06/16/22 221 lb 6 oz (100.4 kg)  07/05/21 219 lb 3 oz (99.4 kg)  03/30/20 208 lb 7 oz (94.5 kg)     Health Maintenance Due  Topic Date Due   COVID-19 Vaccine (4 - Pfizer series) 12/12/2020    There are no preventive care reminders to display for this patient.  Lab Results  Component Value Date   TSH 1.10 07/08/2019   Lab Results  Component Value Date   HGB 15.2 POINT OF CARE RESULT 05/24/2008   Lab Results  Component Value Date   NA 139 07/01/2021   K 3.7 07/01/2021   CO2 25 07/01/2021   GLUCOSE 99 07/01/2021   BUN 10  07/01/2021   CREATININE 1.35 07/01/2021   BILITOT 0.8 07/01/2021   ALKPHOS 37 (L) 07/01/2021   AST 51 (H) 07/01/2021   ALT 36 07/01/2021   PROT 7.1 07/01/2021   ALBUMIN 4.1 07/01/2021   CALCIUM 9.0 07/01/2021   GFR 67.25 07/01/2021   No results found for: "HGBA1C"    Assessment & Plan:   Problem List Items Addressed This Visit       Musculoskeletal and Integument   Eczema - Primary    Fragrance free soaps Aquaphor, triamcinolone cream post shower for trouble spots Zyrtec prn   Suspected heat rash after mowing, try to cut down on sweating as able. Carry towel with you when mowing.       Relevant Medications   triamcinolone cream (KENALOG) 0.1 %   Keratosis pilaris    Left upper back with this appearance Use a good  Moisturizer such as cerave SA or Secretary/administrator given with education on this condition, benign Keep eczema controlled with techniques discussed to avoid exacerbating condition F/u with pcp next week for scheduled labs If no improvement will refer to dermatology       Meds ordered this encounter  Medications   triamcinolone cream (KENALOG) 0.1 %    Sig: Apply 1 Application topically 2 (two) times daily.    Dispense:  30 g    Refill:  0    Order Specific Question:   Supervising Provider    Answer:   BEDSOLE, AMY E [[id number]    Follow-up: No follow-ups on file.    Eugenia Pancoast, FNP

## 2022-06-16 NOTE — Assessment & Plan Note (Signed)
Left upper back with this appearance Use a good  Moisturizer such as cerave SA or Software engineer given with education on this condition, benign Keep eczema controlled with techniques discussed to avoid exacerbating condition F/u with pcp next week for scheduled labs If no improvement will refer to dermatology

## 2022-07-04 ENCOUNTER — Other Ambulatory Visit (INDEPENDENT_AMBULATORY_CARE_PROVIDER_SITE_OTHER): Payer: Managed Care, Other (non HMO)

## 2022-07-04 DIAGNOSIS — E782 Mixed hyperlipidemia: Secondary | ICD-10-CM

## 2022-07-04 DIAGNOSIS — R7401 Elevation of levels of liver transaminase levels: Secondary | ICD-10-CM

## 2022-07-04 LAB — COMPREHENSIVE METABOLIC PANEL WITH GFR
ALT: 30 U/L (ref 0–53)
AST: 29 U/L (ref 0–37)
Albumin: 4.4 g/dL (ref 3.5–5.2)
Alkaline Phosphatase: 49 U/L (ref 39–117)
BUN: 17 mg/dL (ref 6–23)
CO2: 27 meq/L (ref 19–32)
Calcium: 9 mg/dL (ref 8.4–10.5)
Chloride: 106 meq/L (ref 96–112)
Creatinine, Ser: 1.46 mg/dL (ref 0.40–1.50)
GFR: 60.79 mL/min
Glucose, Bld: 94 mg/dL (ref 70–99)
Potassium: 4.1 meq/L (ref 3.5–5.1)
Sodium: 142 meq/L (ref 135–145)
Total Bilirubin: 0.4 mg/dL (ref 0.2–1.2)
Total Protein: 6.8 g/dL (ref 6.0–8.3)

## 2022-07-04 LAB — LIPID PANEL
Cholesterol: 124 mg/dL (ref 0–200)
HDL: 24.1 mg/dL — ABNORMAL LOW (ref >39.00)
LDL Cholesterol: 87 mg/dL (ref 0–99)
NonHDL: 99.95
Total CHOL/HDL Ratio: 5
Triglycerides: 64 mg/dL (ref 0.0–149.0)
VLDL: 12.8 mg/dL (ref 0.0–40.0)

## 2022-07-04 LAB — TSH: TSH: 1.08 u[IU]/mL (ref 0.35–5.50)

## 2022-07-11 ENCOUNTER — Encounter: Payer: Self-pay | Admitting: Family Medicine

## 2022-07-11 ENCOUNTER — Ambulatory Visit (INDEPENDENT_AMBULATORY_CARE_PROVIDER_SITE_OTHER): Payer: Managed Care, Other (non HMO) | Admitting: Family Medicine

## 2022-07-11 VITALS — BP 126/76 | HR 66 | Temp 97.9°F | Ht 71.5 in | Wt 219.0 lb

## 2022-07-11 DIAGNOSIS — R7401 Elevation of levels of liver transaminase levels: Secondary | ICD-10-CM

## 2022-07-11 DIAGNOSIS — E782 Mixed hyperlipidemia: Secondary | ICD-10-CM | POA: Diagnosis not present

## 2022-07-11 DIAGNOSIS — E663 Overweight: Secondary | ICD-10-CM | POA: Insufficient documentation

## 2022-07-11 DIAGNOSIS — E669 Obesity, unspecified: Secondary | ICD-10-CM | POA: Diagnosis not present

## 2022-07-11 DIAGNOSIS — E66811 Obesity, class 1: Secondary | ICD-10-CM

## 2022-07-11 DIAGNOSIS — Z0000 Encounter for general adult medical examination without abnormal findings: Secondary | ICD-10-CM | POA: Diagnosis not present

## 2022-07-11 MED ORDER — ATORVASTATIN CALCIUM 40 MG PO TABS
40.0000 mg | ORAL_TABLET | Freq: Every day | ORAL | 3 refills | Status: DC
Start: 2022-07-11 — End: 2023-11-10

## 2022-07-11 NOTE — Assessment & Plan Note (Signed)
This has resolved with better cholesterol control

## 2022-07-11 NOTE — Progress Notes (Addendum)
Patient ID: Jorge Simpson, male    DOB: 08/19/1986, 36 y.o.   MRN: [id number]  This visit was conducted in person.  BP 126/76   Pulse 66   Temp 97.9 F (36.6 C) (Temporal)   Ht 5' 11.5" (1.816 m)   Wt 219 lb (99.3 kg)   SpO2 96%   BMI 30.12 kg/m    CC: CPE Subjective:   HPI: Jorge Simpson is a 36 y.o. male presenting on 07/11/2022 for Annual Exam   Child with COVID complicated by MIS-C 2021 - he is doing well.   Fmhx HLD - but no CAD or CVA besides paternal grandfather at age 23s.    Preventative: Does not receive flu shot COVID vaccine - Pfizer 02/2020, 03/2020, booster 10/2020  Tdap - 05/2017 Seat belt use discussed Sunscreen use discussed. No changing moles on skin.  Sleep - averaging 6-7 hours/night  Non smoker Alcohol - socially  Dentist q6 mo  Eye exam yearly   Lives with wife and step son (2014) and son (2018) Occupation: [employer] - currently on strike  Edu: BS in Oceanographer at [employer] Activity: works out at gym 5x/wk, Presenter, broadcasting  Diet: good water, fruits/vegetables daily - avoids fast food      Relevant past medical, surgical, family and social history reviewed and updated as indicated. Interim medical history since our last visit reviewed. Allergies and medications reviewed and updated. Outpatient Medications Prior to Visit  Medication Sig Dispense Refill   Multiple Vitamins-Minerals (MULTIVITAMIN & MINERAL PO) Take 1 tablet by mouth daily.     triamcinolone cream (KENALOG) 0.1 % Apply 1 Application topically 2 (two) times daily. 30 g 0   atorvastatin (LIPITOR) 40 MG tablet Take 1 tablet (40 mg total) by mouth daily. 90 tablet 3   No facility-administered medications prior to visit.     Per HPI unless specifically indicated in ROS section below Review of Systems  Constitutional:  Negative for activity change, appetite change, chills, fatigue, fever and unexpected weight change.  HENT:  Negative for hearing loss.   Eyes:  Negative for visual  disturbance.  Respiratory:  Negative for cough, chest tightness, shortness of breath and wheezing.   Cardiovascular:  Negative for chest pain, palpitations and leg swelling.  Gastrointestinal:  Negative for abdominal distention, abdominal pain, blood in stool, constipation, diarrhea, nausea and vomiting.  Genitourinary:  Negative for difficulty urinating and hematuria.  Musculoskeletal:  Negative for arthralgias, myalgias and neck pain.  Skin:  Negative for rash.  Neurological:  Negative for dizziness, seizures, syncope and headaches.  Hematological:  Negative for adenopathy. Does not bruise/bleed easily.  Psychiatric/Behavioral:  Negative for dysphoric mood. The patient is not nervous/anxious.     Objective:  BP 126/76   Pulse 66   Temp 97.9 F (36.6 C) (Temporal)   Ht 5' 11.5" (1.816 m)   Wt 219 lb (99.3 kg)   SpO2 96%   BMI 30.12 kg/m   Wt Readings from Last 3 Encounters:  07/11/22 219 lb (99.3 kg)  06/16/22 221 lb 6 oz (100.4 kg)  07/05/21 219 lb 3 oz (99.4 kg)      Physical Exam Vitals and nursing note reviewed.  Constitutional:      General: He is not in acute distress.    Appearance: Normal appearance. He is well-developed. He is not ill-appearing.  HENT:     Head: Normocephalic and atraumatic.     Right Ear: Hearing, tympanic membrane, ear canal and external ear normal.  Left Ear: Hearing, tympanic membrane, ear canal and external ear normal.  Eyes:     General: No scleral icterus.    Extraocular Movements: Extraocular movements intact.     Conjunctiva/sclera: Conjunctivae normal.     Pupils: Pupils are equal, round, and reactive to light.  Neck:     Thyroid: No thyroid mass or thyromegaly.  Cardiovascular:     Rate and Rhythm: Normal rate and regular rhythm.     Pulses: Normal pulses.          Radial pulses are 2+ on the right side and 2+ on the left side.     Heart sounds: Normal heart sounds. No murmur heard. Pulmonary:     Effort: Pulmonary effort is  normal. No respiratory distress.     Breath sounds: Normal breath sounds. No wheezing, rhonchi or rales.  Abdominal:     General: Bowel sounds are normal. There is no distension.     Palpations: Abdomen is soft. There is no mass.     Tenderness: There is no abdominal tenderness. There is no guarding or rebound.     Hernia: No hernia is present.  Musculoskeletal:        General: Normal range of motion.     Cervical back: Normal range of motion and neck supple.     Right lower leg: No edema.     Left lower leg: No edema.  Lymphadenopathy:     Cervical: No cervical adenopathy.  Skin:    General: Skin is warm and dry.     Findings: No rash.  Neurological:     General: No focal deficit present.     Mental Status: He is alert and oriented to person, place, and time.  Psychiatric:        Mood and Affect: Mood normal.        Behavior: Behavior normal.        Thought Content: Thought content normal.        Judgment: Judgment normal.       Results for orders placed or performed in visit on 07/04/22  TSH  Result Value Ref Range   TSH 1.08 0.35 - 5.50 uIU/mL  Comprehensive metabolic panel  Result Value Ref Range   Sodium 142 135 - 145 mEq/L   Potassium 4.1 3.5 - 5.1 mEq/L   Chloride 106 96 - 112 mEq/L   CO2 27 19 - 32 mEq/L   Glucose, Bld 94 70 - 99 mg/dL   BUN 17 6 - 23 mg/dL   Creatinine, Ser 7.56 0.40 - 1.50 mg/dL   Total Bilirubin 0.4 0.2 - 1.2 mg/dL   Alkaline Phosphatase 49 39 - 117 U/L   AST 29 0 - 37 U/L   ALT 30 0 - 53 U/L   Total Protein 6.8 6.0 - 8.3 g/dL   Albumin 4.4 3.5 - 5.2 g/dL   GFR 43.32 >95.18 mL/min   Calcium 9.0 8.4 - 10.5 mg/dL  Lipid panel  Result Value Ref Range   Cholesterol 124 0 - 200 mg/dL   Triglycerides 84.1 0.0 - 149.0 mg/dL   HDL 66.06 (L) >30.16 mg/dL   VLDL 01.0 0.0 - 93.2 mg/dL   LDL Cholesterol 87 0 - 99 mg/dL   Total CHOL/HDL Ratio 5    NonHDL 99.95     Assessment & Plan:   Problem List Items Addressed This Visit     Health  maintenance examination - Primary (Chronic)    Preventative protocols reviewed and updated unless  pt declined. Discussed healthy diet and lifestyle.       Transaminitis    This has resolved with better cholesterol control      HLD (hyperlipidemia)    Chronic, stable on lipitor 40mg  daily - started when LDL >190. Check Lp(a) and apolipoprotein B next labwork. Anticipate component of familial hyperlipidemia.  The ASCVD Risk score (Arnett DK, et al., 2019) failed to calculate for the following reasons:   The 2019 ASCVD risk score is only valid for ages 73 to 19       Relevant Medications   atorvastatin (LIPITOR) 40 MG tablet   Obesity, Class I, BMI 30-34.9    Overall healthy. Stays active working out at gym.         Meds ordered this encounter  Medications   atorvastatin (LIPITOR) 40 MG tablet    Sig: Take 1 tablet (40 mg total) by mouth daily.    Dispense:  90 tablet    Refill:  3   No orders of the defined types were placed in this encounter.    Patient instructions: You are doing well today Continue lipitor. Return as needed or in 1 year for next physical.   Follow up plan: Return in about 1 year (around 07/12/2023) for annual exam, prior fasting for blood work.  07/14/2023, MD

## 2022-07-11 NOTE — Assessment & Plan Note (Signed)
Overall healthy. Stays active working out at gym.

## 2022-07-11 NOTE — Assessment & Plan Note (Addendum)
Chronic, stable on lipitor 40mg  daily - started when LDL >190. Check Lp(a) and apolipoprotein B next labwork. Anticipate component of familial hyperlipidemia.  The ASCVD Risk score (Arnett DK, et al., 2019) failed to calculate for the following reasons:   The 2019 ASCVD risk score is only valid for ages 79 to 27

## 2022-07-11 NOTE — Patient Instructions (Signed)
You are doing well today Continue lipitor. Return as needed or in 1 year for next physical.   Health Maintenance, Male Adopting a healthy lifestyle and getting preventive care are important in promoting health and wellness. Ask your health care provider about: The right schedule for you to have regular tests and exams. Things you can do on your own to prevent diseases and keep yourself healthy. What should I know about diet, weight, and exercise? Eat a healthy diet  Eat a diet that includes plenty of vegetables, fruits, low-fat dairy products, and lean protein. Do not eat a lot of foods that are high in solid fats, added sugars, or sodium. Maintain a healthy weight Body mass index (BMI) is a measurement that can be used to identify possible weight problems. It estimates body fat based on height and weight. Your health care provider can help determine your BMI and help you achieve or maintain a healthy weight. Get regular exercise Get regular exercise. This is one of the most important things you can do for your health. Most adults should: Exercise for at least 150 minutes each week. The exercise should increase your heart rate and make you sweat (moderate-intensity exercise). Do strengthening exercises at least twice a week. This is in addition to the moderate-intensity exercise. Spend less time sitting. Even light physical activity can be beneficial. Watch cholesterol and blood lipids Have your blood tested for lipids and cholesterol at 38 years of age, then have this test every 5 years. You may need to have your cholesterol levels checked more often if: Your lipid or cholesterol levels are high. You are older than 38 years of age. You are at high risk for heart disease. What should I know about cancer screening? Many types of cancers can be detected early and may often be prevented. Depending on your health history and family history, you may need to have cancer screening at various  ages. This may include screening for: Colorectal cancer. Prostate cancer. Skin cancer. Lung cancer. What should I know about heart disease, diabetes, and high blood pressure? Blood pressure and heart disease High blood pressure causes heart disease and increases the risk of stroke. This is more likely to develop in people who have high blood pressure readings or are overweight. Talk with your health care provider about your target blood pressure readings. Have your blood pressure checked: Every 3-5 years if you are 33-62 years of age. Every year if you are 2 years old or older. If you are between the ages of 22 and 29 and are a current or former smoker, ask your health care provider if you should have a one-time screening for abdominal aortic aneurysm (AAA). Diabetes Have regular diabetes screenings. This checks your fasting blood sugar level. Have the screening done: Once every three years after age 73 if you are at a normal weight and have a low risk for diabetes. More often and at a younger age if you are overweight or have a high risk for diabetes. What should I know about preventing infection? Hepatitis B If you have a higher risk for hepatitis B, you should be screened for this virus. Talk with your health care provider to find out if you are at risk for hepatitis B infection. Hepatitis C Blood testing is recommended for: Everyone born from [birth year] through 1965. Anyone with known risk factors for hepatitis C. Sexually transmitted infections (STIs) You should be screened each year for STIs, including gonorrhea and chlamydia, if: You are sexually  active and are younger than 38 years of age. You are older than 38 years of age and your health care provider tells you that you are at risk for this type of infection. Your sexual activity has changed since you were last screened, and you are at increased risk for chlamydia or gonorrhea. Ask your health care provider if you are at risk. Ask  your health care provider about whether you are at high risk for HIV. Your health care provider may recommend a prescription medicine to help prevent HIV infection. If you choose to take medicine to prevent HIV, you should first get tested for HIV. You should then be tested every 3 months for as long as you are taking the medicine. Follow these instructions at home: Alcohol use Do not drink alcohol if your health care provider tells you not to drink. If you drink alcohol: Limit how much you have to 0-2 drinks a day. Know how much alcohol is in your drink. In the [place] one drink equals one 12 oz bottle of beer (355 mL), one 5 oz glass of wine (148 mL), or one 1 oz glass of hard liquor (44 mL). Lifestyle Do not use any products that contain nicotine or tobacco. These products include cigarettes, chewing tobacco, and vaping devices, such as e-cigarettes. If you need help quitting, ask your health care provider. Do not use street drugs. Do not share needles. Ask your health care provider for help if you need support or information about quitting drugs. General instructions Schedule regular health, dental, and eye exams. Stay current with your vaccines. Tell your health care provider if: You often feel depressed. You have ever been abused or do not feel safe at home. Summary Adopting a healthy lifestyle and getting preventive care are important in promoting health and wellness. Follow your health care provider's instructions about healthy diet, exercising, and getting tested or screened for diseases. Follow your health care provider's instructions on monitoring your cholesterol and blood pressure. This information is not intended to replace advice given to you by your health care provider. Make sure you discuss any questions you have with your health care provider. Document Revised: 04/22/2021 Document Reviewed: 04/22/2021 Elsevier Patient Education  [place].

## 2022-07-11 NOTE — Assessment & Plan Note (Signed)
Preventative protocols reviewed and updated unless pt declined. Discussed healthy diet and lifestyle.  

## 2022-11-23 ENCOUNTER — Other Ambulatory Visit: Payer: Self-pay | Admitting: Family

## 2022-11-23 DIAGNOSIS — L309 Dermatitis, unspecified: Secondary | ICD-10-CM

## 2023-02-11 ENCOUNTER — Ambulatory Visit (INDEPENDENT_AMBULATORY_CARE_PROVIDER_SITE_OTHER): Payer: Managed Care, Other (non HMO) | Admitting: Family Medicine

## 2023-02-11 ENCOUNTER — Encounter: Payer: Self-pay | Admitting: Family Medicine

## 2023-02-11 VITALS — BP 124/82 | HR 88 | Temp 97.6°F | Ht 71.5 in | Wt 220.5 lb

## 2023-02-11 DIAGNOSIS — R21 Rash and other nonspecific skin eruption: Secondary | ICD-10-CM

## 2023-02-11 DIAGNOSIS — L309 Dermatitis, unspecified: Secondary | ICD-10-CM

## 2023-02-11 MED ORDER — FLUCONAZOLE 150 MG PO TABS: 150.0000 mg | ORAL_TABLET | ORAL | 0 refills | Status: DC

## 2023-02-11 MED ORDER — CLOBETASOL PROPIONATE 0.05 % EX CREA: 1.0000 | TOPICAL_CREAM | Freq: Two times a day (BID) | CUTANEOUS | 0 refills | Status: DC

## 2023-02-11 NOTE — Patient Instructions (Addendum)
Possible nummular eczema vs fungal component.  Take stronger steroid cream - apply to affected areas twice daily as needed (no more than 10 days at a time).  Use clotimazole over the counter antifungal under arms. Take diflucan antifungal pill once weekly for 3 weeks. While taking diflucan for 3 weeks, drop atorvastatin to twice weekly.

## 2023-02-11 NOTE — Progress Notes (Signed)
Patient ID: Jorge Simpson, male    DOB: 09/17/1986, 37 y.o.   MRN: [id number]  This visit was conducted in person.  BP 124/82   Pulse 88   Temp 97.6 F (36.4 C) (Temporal)   Ht 5' 11.5" (1.816 m)   Wt 220 lb 8 oz (100 kg)   SpO2 96%   BMI 30.32 kg/m    CC: follow up skin rash  Subjective:   HPI: Jorge Simpson is a 37 y.o. male presenting on 02/11/2023 for Rash (C/o rash on legs. Noticed last yr. Seen in 06/2022 for same sxs. Has new breakout in addition to original rash. )   Seen over summer with rash to forearms thought eczema related - treated with aquaphor, TCI cream and prn zyrtec. Also thought KP related upper back rash treated with moisturizing cream. Treatment didn't seem to help   Ongoing rash for the past year - 2 spots on R upper back, newer spots on R thigh and calves, as well as itchy rash under arms, dark axillary areas.   Treating with TCI cream, regular use of jergens and aveeno lotions.  He avoids fragrance containing creams/lotions.  No new lotions, detergents, soaps or shampoos, new medicines, or vitamins, supplements, new foods.  No oral lesions No fevers/chills, joint pains. No rash to face or upper chest.   Upcoming derm eval May 9th ([facility]).      Relevant past medical, surgical, family and social history reviewed and updated as indicated. Interim medical history since our last visit reviewed. Allergies and medications reviewed and updated. Outpatient Medications Prior to Visit  Medication Sig Dispense Refill   atorvastatin (LIPITOR) 40 MG tablet Take 1 tablet (40 mg total) by mouth daily. 90 tablet 3   Multiple Vitamins-Minerals (MULTIVITAMIN & MINERAL PO) Take 1 tablet by mouth daily.     triamcinolone cream (KENALOG) 0.1 % APPLY TO AFFECTED AREA TWICE A DAY 30 g 0   No facility-administered medications prior to visit.     Per HPI unless specifically indicated in ROS section below Review of Systems  Objective:  BP 124/82   Pulse 88    Temp 97.6 F (36.4 C) (Temporal)   Ht 5' 11.5" (1.816 m)   Wt 220 lb 8 oz (100 kg)   SpO2 96%   BMI 30.32 kg/m   Wt Readings from Last 3 Encounters:  02/11/23 220 lb 8 oz (100 kg)  07/11/22 219 lb (99.3 kg)  06/16/22 221 lb 6 oz (100.4 kg)      Physical Exam Vitals and nursing note reviewed.  Constitutional:      Appearance: Normal appearance. He is not ill-appearing.  Skin:    General: Skin is warm and dry.     Findings: Rash present. No erythema.     Comments:  Maculopapular patch to left upper back (most extensive area - see photo) as well as smaller annular patches of hyperpigmented patches of dry skin throughout extremities, few on abdomen. No scaling, no erythema, no warmth, no drainage.  Hyperpigmented slightly erythematous patches bilateral axillae.   Neurological:     Mental Status: He is alert.       R lateral upper back:   Lab Results  Component Value Date   ALT 30 07/04/2022   AST 29 07/04/2022   ALKPHOS 49 07/04/2022   BILITOT 0.4 07/04/2022     Assessment & Plan:   Problem List Items Addressed This Visit     Skin rash - Primary  Anticipate combination of 2 rashes - possible nummular eczema to rash to extremities and trunk, and possible tinea/fungal infection to bilateral axilla.  Rx clobetasol topically with steroid precautions.  Rx clotrimazole under arms. Rx diflucan '150mg'$  weekly x 3 wks for possible fungal component.  He also has upcoming dermatology appt 04/2023.       Eczema    Presumed nummular eczema component to skin rash of body.  Pending derm eval.  Rx clobetasol PRN.         Meds ordered this encounter  Medications   clobetasol cream (TEMOVATE) 0.05 %    Sig: Apply 1 Application topically 2 (two) times daily. Apply to AA    Dispense:  45 g    Refill:  0   fluconazole (DIFLUCAN) 150 MG tablet    Sig: Take 1 tablet (150 mg total) by mouth once a week.    Dispense:  3 tablet    Refill:  0    No orders of the defined types  were placed in this encounter.   Patient Instructions  Possible nummular eczema vs fungal component.  Take stronger steroid cream - apply to affected areas twice daily as needed (no more than 10 days at a time).  Use clotimazole over the counter antifungal under arms. Take diflucan antifungal pill once weekly for 3 weeks. While taking diflucan for 3 weeks, drop atorvastatin to twice weekly.   Follow up plan: No follow-ups on file.  Ria Bush, MD

## 2023-02-11 NOTE — Assessment & Plan Note (Signed)
Anticipate combination of 2 rashes - possible nummular eczema to rash to extremities and trunk, and possible tinea/fungal infection to bilateral axilla.  Rx clobetasol topically with steroid precautions.  Rx clotrimazole under arms. Rx diflucan '150mg'$  weekly x 3 wks for possible fungal component.  He also has upcoming dermatology appt 04/2023.

## 2023-02-11 NOTE — Assessment & Plan Note (Signed)
Presumed nummular eczema component to skin rash of body.  Pending derm eval.  Rx clobetasol PRN.

## 2023-09-15 ENCOUNTER — Other Ambulatory Visit: Payer: Self-pay | Admitting: Family Medicine

## 2023-11-10 ENCOUNTER — Encounter: Payer: Self-pay | Admitting: Family Medicine

## 2023-11-10 ENCOUNTER — Ambulatory Visit: Payer: Managed Care, Other (non HMO) | Admitting: Family Medicine

## 2023-11-10 VITALS — BP 132/82 | HR 79 | Temp 98.3°F | Ht 71.5 in | Wt 212.4 lb

## 2023-11-10 DIAGNOSIS — L608 Other nail disorders: Secondary | ICD-10-CM

## 2023-11-10 DIAGNOSIS — L309 Dermatitis, unspecified: Secondary | ICD-10-CM

## 2023-11-10 DIAGNOSIS — E782 Mixed hyperlipidemia: Secondary | ICD-10-CM

## 2023-11-10 DIAGNOSIS — Z0000 Encounter for general adult medical examination without abnormal findings: Secondary | ICD-10-CM | POA: Diagnosis not present

## 2023-11-10 MED ORDER — ATORVASTATIN CALCIUM 40 MG PO TABS: 40.0000 mg | ORAL_TABLET | Freq: Every day | ORAL | 4 refills | Status: DC

## 2023-11-10 NOTE — Assessment & Plan Note (Addendum)
Update lipid panel off statin - he had run out of atorvastatin for the past month.  The ASCVD Risk score (Arnett DK, et al., 2019) failed to calculate for the following reasons:   The 2019 ASCVD risk score is only valid for ages 4 to 29

## 2023-11-10 NOTE — Patient Instructions (Addendum)
Labs today  Atorvastatin refilled.  Nail changes likely benign change (melanonychia).  You are doing well today Return as needed or in 1 year for next physical

## 2023-11-10 NOTE — Assessment & Plan Note (Deleted)
Update LFT's

## 2023-11-10 NOTE — Assessment & Plan Note (Signed)
Anticipate benign process - will monitor.

## 2023-11-10 NOTE — Assessment & Plan Note (Signed)
Preventative protocols reviewed and updated unless pt declined. Discussed healthy diet and lifestyle.  

## 2023-11-10 NOTE — Progress Notes (Signed)
Ph: [phone] Fax: [phone]   Patient ID: Jorge Simpson, male    DOB: [birth year], 37 y.o.   MRN: [id number]  This visit was conducted in person.  BP 132/82   Pulse 79   Temp 98.3 F (36.8 C) (Oral)   Ht 5' 11.5" (1.816 m)   Wt 212 lb 6 oz (96.3 kg)   SpO2 98%   BMI 29.21 kg/m    CC: CPE Subjective:   HPI: Jorge Simpson is a 37 y.o. male presenting on 11/10/2023 for Annual Exam   Fmhx HLD - but no CAD or CVA besides paternal grandfather at age 60s.   Preventative: Flu shot - declined COVID vaccine - Pfizer 02/2020, 03/2020, booster 10/2020  Td 2006, Tdap - 05/2017 Seat belt use discussed Sunscreen use discussed. No changing moles on skin.  Sleep - averaging 6.5 hours/night  Non smoker Alcohol - socially  Dentist q6 mo  Eye exam yearly   Lives with wife and step son (2014) and son (2018) Occupation: [employer] since age 26yo Edu: BS in Oceanographer at [employer]  Activity: works out at gym 5x/wk  Diet: good water, fruits/vegetables daily      Relevant past medical, surgical, family and social history reviewed and updated as indicated. Interim medical history since our last visit reviewed. Allergies and medications reviewed and updated. Outpatient Medications Prior to Visit  Medication Sig Dispense Refill   clobetasol cream (TEMOVATE) 0.05 % Apply 1 Application topically 2 (two) times daily. Apply to AA (Patient taking differently: Apply 1 Application topically 2 (two) times daily. Apply to AA as needed) 45 g 0   Multiple Vitamins-Minerals (MULTIVITAMIN & MINERAL PO) Take 1 tablet by mouth daily.     atorvastatin (LIPITOR) 40 MG tablet Take 1 tablet (40 mg total) by mouth daily. 90 tablet 3   fluconazole (DIFLUCAN) 150 MG tablet Take 1 tablet (150 mg total) by mouth once a week. 3 tablet 0   triamcinolone cream (KENALOG) 0.1 % APPLY TO AFFECTED AREA TWICE A DAY 30 g 0   No facility-administered medications prior to visit.     Per HPI unless specifically  indicated in ROS section below Review of Systems  Constitutional:  Negative for activity change, appetite change, chills, fatigue, fever and unexpected weight change.  HENT:  Negative for hearing loss.   Eyes:  Negative for visual disturbance.  Respiratory:  Negative for cough, chest tightness, shortness of breath and wheezing.   Cardiovascular:  Negative for chest pain, palpitations and leg swelling.  Gastrointestinal:  Negative for abdominal distention, abdominal pain, blood in stool, constipation, diarrhea, nausea and vomiting.  Genitourinary:  Negative for difficulty urinating and hematuria.  Musculoskeletal:  Negative for arthralgias, myalgias and neck pain.  Skin:  Negative for rash.  Neurological:  Negative for dizziness, seizures, syncope and headaches.  Hematological:  Negative for adenopathy. Does not bruise/bleed easily.  Psychiatric/Behavioral:  Negative for dysphoric mood. The patient is not nervous/anxious.     Objective:  BP 132/82   Pulse 79   Temp 98.3 F (36.8 C) (Oral)   Ht 5' 11.5" (1.816 m)   Wt 212 lb 6 oz (96.3 kg)   SpO2 98%   BMI 29.21 kg/m   Wt Readings from Last 3 Encounters:  11/10/23 212 lb 6 oz (96.3 kg)  02/11/23 220 lb 8 oz (100 kg)  07/11/22 219 lb (99.3 kg)      Physical Exam Vitals and nursing note reviewed.  Constitutional:  General: He is not in acute distress.    Appearance: Normal appearance. He is well-developed. He is not ill-appearing.  HENT:     Head: Normocephalic and atraumatic.     Right Ear: Hearing, tympanic membrane, ear canal and external ear normal.     Left Ear: Hearing, tympanic membrane, ear canal and external ear normal.     Nose: Nose normal.     Mouth/Throat:     Mouth: Mucous membranes are moist.     Pharynx: Oropharynx is clear. No oropharyngeal exudate or posterior oropharyngeal erythema.  Eyes:     General: No scleral icterus.    Extraocular Movements: Extraocular movements intact.     Conjunctiva/sclera:  Conjunctivae normal.     Pupils: Pupils are equal, round, and reactive to light.  Neck:     Thyroid: No thyroid mass or thyromegaly.  Cardiovascular:     Rate and Rhythm: Normal rate and regular rhythm.     Pulses: Normal pulses.          Radial pulses are 2+ on the right side and 2+ on the left side.     Heart sounds: Normal heart sounds. No murmur heard. Pulmonary:     Effort: Pulmonary effort is normal. No respiratory distress.     Breath sounds: Normal breath sounds. No wheezing, rhonchi or rales.  Abdominal:     General: Bowel sounds are normal. There is no distension.     Palpations: Abdomen is soft. There is no mass.     Tenderness: There is no abdominal tenderness. There is no guarding or rebound.     Hernia: No hernia is present.  Musculoskeletal:        General: Normal range of motion.     Cervical back: Normal range of motion and neck supple.     Right lower leg: No edema.     Left lower leg: No edema.  Lymphadenopathy:     Cervical: No cervical adenopathy.  Skin:    General: Skin is warm and dry.     Findings: No rash.     Comments: Right 2nd and 5th toenails with dark pigment to entire nails with pseudo-hutchinson sign.   Neurological:     General: No focal deficit present.     Mental Status: He is alert and oriented to person, place, and time.  Psychiatric:        Mood and Affect: Mood normal.        Behavior: Behavior normal.        Thought Content: Thought content normal.        Judgment: Judgment normal.       Results for orders placed or performed in visit on 07/04/22  TSH  Result Value Ref Range   TSH 1.08 0.35 - 5.50 uIU/mL  Comprehensive metabolic panel  Result Value Ref Range   Sodium 142 135 - 145 mEq/L   Potassium 4.1 3.5 - 5.1 mEq/L   Chloride 106 96 - 112 mEq/L   CO2 27 19 - 32 mEq/L   Glucose, Bld 94 70 - 99 mg/dL   BUN 17 6 - 23 mg/dL   Creatinine, Ser 1.61 0.40 - 1.50 mg/dL   Total Bilirubin 0.4 0.2 - 1.2 mg/dL   Alkaline Phosphatase  49 39 - 117 U/L   AST 29 0 - 37 U/L   ALT 30 0 - 53 U/L   Total Protein 6.8 6.0 - 8.3 g/dL   Albumin 4.4 3.5 - 5.2 g/dL  GFR 60.79 >60.00 mL/min   Calcium 9.0 8.4 - 10.5 mg/dL  Lipid panel  Result Value Ref Range   Cholesterol 124 0 - 200 mg/dL   Triglycerides 72.5 0.0 - 149.0 mg/dL   HDL 36.64 (L) >40.34 mg/dL   VLDL 74.2 0.0 - 59.5 mg/dL   LDL Cholesterol 87 0 - 99 mg/dL   Total CHOL/HDL Ratio 5    NonHDL 99.95     Assessment & Plan:   Problem List Items Addressed This Visit     Health maintenance examination - Primary (Chronic)    Preventative protocols reviewed and updated unless pt declined. Discussed healthy diet and lifestyle.       HLD (hyperlipidemia)    Update lipid panel off statin - he had run out of atorvastatin for the past month.  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       Longitudinal melanonychia    Anticipate benign process - will monitor.         Meds ordered this encounter  Medications   atorvastatin (LIPITOR) 40 MG tablet    Sig: Take 1 tablet (40 mg total) by mouth daily.    Dispense:  90 tablet    Refill:  4    Orders Placed This Encounter  Procedures   Lipid panel   Comprehensive metabolic panel    Patient Instructions  Labs today  Atorvastatin refilled.  Nail changes likely benign change (melanonychia).  You are doing well today Return as needed or in 1 year for next physical  Follow up plan: Return in about 1 year (around 11/09/2024) for annual exam, prior fasting for blood work.  Eustaquio Boyden, MD

## 2023-11-10 NOTE — Assessment & Plan Note (Signed)
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

## 2023-11-11 LAB — LIPID PANEL
Cholesterol: 194 mg/dL (ref 0–200)
HDL: 41.2 mg/dL (ref >39.00)
LDL Cholesterol: 124 mg/dL — ABNORMAL HIGH (ref 0–99)
NonHDL: 153.28
Total CHOL/HDL Ratio: 5
Triglycerides: 148 mg/dL (ref 0.0–149.0)
VLDL: 29.6 mg/dL (ref 0.0–40.0)

## 2023-11-11 LAB — COMPREHENSIVE METABOLIC PANEL
ALT: 19 U/L (ref 0–53)
AST: 26 U/L (ref 0–37)
Albumin: 4.5 g/dL (ref 3.5–5.2)
Alkaline Phosphatase: 59 U/L (ref 39–117)
BUN: 13 mg/dL (ref 6–23)
CO2: 29 meq/L (ref 19–32)
Calcium: 9.2 mg/dL (ref 8.4–10.5)
Chloride: 103 meq/L (ref 96–112)
Creatinine, Ser: 1.24 mg/dL (ref 0.40–1.50)
GFR: 73.25 mL/min (ref >60.00)
Glucose, Bld: 84 mg/dL (ref 70–99)
Potassium: 3.9 meq/L (ref 3.5–5.1)
Sodium: 140 meq/L (ref 135–145)
Total Bilirubin: 0.6 mg/dL (ref 0.2–1.2)
Total Protein: 7.1 g/dL (ref 6.0–8.3)

## 2024-02-08 ENCOUNTER — Other Ambulatory Visit: Payer: Self-pay | Admitting: Family Medicine

## 2024-02-09 NOTE — Telephone Encounter (Signed)
 ERx

## 2024-02-09 NOTE — Telephone Encounter (Signed)
 Clobetasol crm Last filled:  02/11/23, #45 g Last OV:  11/10/23, CPE Next OV:  none

## 2024-06-08 ENCOUNTER — Encounter (HOSPITAL_BASED_OUTPATIENT_CLINIC_OR_DEPARTMENT_OTHER): Payer: Self-pay | Admitting: Student

## 2024-06-08 ENCOUNTER — Ambulatory Visit (HOSPITAL_BASED_OUTPATIENT_CLINIC_OR_DEPARTMENT_OTHER): Admitting: Student

## 2024-06-08 DIAGNOSIS — S76302A Unspecified injury of muscle, fascia and tendon of the posterior muscle group at thigh level, left thigh, initial encounter: Secondary | ICD-10-CM

## 2024-06-08 NOTE — Progress Notes (Signed)
 Chief Complaint: Left leg pain     History of Present Illness:    Jorge Simpson is a 38 y.o. male who presents today for evaluation of left leg pain.  Patient reports that last week while playing kickball with his kids, he felt a pop in the back of his thigh.  Since then he has had mild but persistent pain in the posterior and lateral aspect of the leg and knee.  Has a sensation that the knee may give out while walking, but he has been able to ambulate around normally.  Denies any numbness or tingling.  Initially he iced the area and took Aleve but has not been utilizing any treatments recently.  Did undergo an ACL repair of this knee in 2010.  He enjoys working out at [employer] consistently.   Surgical History:   ACL repair left 2010  PMH/PSH/Family History/Social History/Meds/Allergies:   History reviewed. No pertinent past medical history. Past Surgical History:  Procedure Laterality Date   ANTERIOR CRUCIATE LIGAMENT REPAIR  2010   Social History   Socioeconomic History   Marital status: Married    Spouse name: Not on file   Number of children: Not on file   Years of education: Not on file   Highest education level: Not on file  Occupational History   Not on file  Tobacco Use   Smoking status: Never   Smokeless tobacco: Never  Substance and Sexual Activity   Alcohol use: Yes    Alcohol/week: 0.0 standard drinks of alcohol    Comment: occasionally   Drug use: No   Sexual activity: Yes    Partners: Female  Other Topics Concern   Not on file  Social History Narrative   Lives alone, 1 dog   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)   Edu: BS in Oceanographer at [employer]   Activity: works out at gym 5x/wk   Diet: good water, fruits/vegetables daily   Social Drivers of Corporate investment banker Strain: Not on Ship broker Insecurity: Not on file  Transportation Needs: Not on file  Physical Activity: Not on file   Stress: Not on file  Social Connections: Not on file   Family History  Problem Relation Age of Onset   Cancer Mother 50       breast, met to bones   Hyperlipidemia Father    Hypertension Father    Stroke Maternal Grandmother 53   Cancer Paternal Grandmother        breast   Cancer Paternal Grandfather        intestinal   CAD Paternal Grandfather 52       MI   Cancer Maternal Aunt        breast   Cancer Maternal Uncle        colon   Cancer Paternal Uncle        prostate   Diabetes Neg Hx    No Known Allergies Current Outpatient Medications  Medication Sig Dispense Refill   atorvastatin  (LIPITOR) 40 MG tablet Take 1 tablet (40 mg total) by mouth daily. 90 tablet 4   clobetasol  cream (TEMOVATE ) 0.05 % Apply 1 Application topically 2 (two) times daily. Apply to AA as needed, no longer than 10 days at a time 45 g 0   Multiple Vitamins-Minerals (  MULTIVITAMIN & MINERAL PO) Take 1 tablet by mouth daily.     No current facility-administered medications for this visit.   No results found.  Review of Systems:   A ROS was performed including pertinent positives and negatives as documented in the HPI.  Physical Exam :   Constitutional: NAD and appears stated age Neurological: Alert and oriented Psych: Appropriate affect and cooperative There were no vitals taken for this visit.   Comprehensive Musculoskeletal Exam:    Exam of the left leg demonstrates diffuse ecchymosis in the posterior third of the thigh.  No significant tenderness within the knee or posterior thigh.  Active knee range of motion from 0 to 130 degrees.  Stable collaterals with varus and valgus stress.  Negative Lachman.  5/5 strength with resisted knee flexion and extension.  Imaging:    Assessment:   38 y.o. male with left leg pain consistent with a hamstring injury that occurred 1 week ago.  Overall his exam is reassuring as he does maintain good strength with knee flexion and the hamstring tendons are  palpable in the posterior knee.  There is some notable ecchymosis in the posterior thigh which given his symptoms appear more consistent with a more significant strain versus some partial tearing.  Bruising and pain have been most notable at the musculotendinous junction.  Will plan to continue with conservative therapies including rest, NSAIDs, gentle stretching, and heat/ice.  Discussed that we could consider addition of physical therapy, shockwave therapy or further imaging should these symptoms persist, although these injuries do often take many weeks for full recovery.  Plan :    - Return to clinic in 3 weeks if symptoms fail to improve     I personally saw and evaluated the patient, and participated in the management and treatment plan.  Leonce Reveal, PA-C Orthopedics

## 2024-06-09 ENCOUNTER — Ambulatory Visit: Admitting: Family Medicine

## 2024-09-28 ENCOUNTER — Encounter

## 2024-10-17 ENCOUNTER — Encounter: Payer: Self-pay | Admitting: Radiology

## 2024-11-14 ENCOUNTER — Ambulatory Visit (INDEPENDENT_AMBULATORY_CARE_PROVIDER_SITE_OTHER): Admitting: Family Medicine

## 2024-11-14 ENCOUNTER — Ambulatory Visit: Payer: Self-pay | Admitting: Family Medicine

## 2024-11-14 VITALS — BP 140/90 | HR 62 | Temp 97.6°F | Ht 71.65 in | Wt 221.4 lb

## 2024-11-14 DIAGNOSIS — G8929 Other chronic pain: Secondary | ICD-10-CM | POA: Insufficient documentation

## 2024-11-14 DIAGNOSIS — Z0000 Encounter for general adult medical examination without abnormal findings: Secondary | ICD-10-CM

## 2024-11-14 DIAGNOSIS — E782 Mixed hyperlipidemia: Secondary | ICD-10-CM | POA: Diagnosis not present

## 2024-11-14 DIAGNOSIS — M25561 Pain in right knee: Secondary | ICD-10-CM | POA: Diagnosis not present

## 2024-11-14 LAB — COMPREHENSIVE METABOLIC PANEL WITH GFR
ALT: 28 U/L (ref 0–53)
AST: 23 U/L (ref 0–37)
Albumin: 4.5 g/dL (ref 3.5–5.2)
Alkaline Phosphatase: 64 U/L (ref 39–117)
BUN: 9 mg/dL (ref 6–23)
CO2: 32 meq/L (ref 19–32)
Calcium: 9.3 mg/dL (ref 8.4–10.5)
Chloride: 103 meq/L (ref 96–112)
Creatinine, Ser: 1.21 mg/dL (ref 0.40–1.50)
GFR: 74.9 mL/min (ref >60.00)
Glucose, Bld: 88 mg/dL (ref 70–99)
Potassium: 4 meq/L (ref 3.5–5.1)
Sodium: 140 meq/L (ref 135–145)
Total Bilirubin: 0.5 mg/dL (ref 0.2–1.2)
Total Protein: 7.2 g/dL (ref 6.0–8.3)

## 2024-11-14 LAB — LIPID PANEL
Cholesterol: 130 mg/dL (ref 0–200)
HDL: 32.1 mg/dL — ABNORMAL LOW (ref >39.00)
LDL Cholesterol: 70 mg/dL (ref 0–99)
NonHDL: 98.25
Total CHOL/HDL Ratio: 4
Triglycerides: 139 mg/dL (ref 0.0–149.0)
VLDL: 27.8 mg/dL (ref 0.0–40.0)

## 2024-11-14 MED ORDER — ATORVASTATIN CALCIUM 40 MG PO TABS: 40.0000 mg | ORAL_TABLET | Freq: Every day | ORAL | 3 refills | Status: AC

## 2024-11-14 NOTE — Assessment & Plan Note (Signed)
 Chronic stable on atorvastatin  - continue. The 10-year ASCVD risk score (Arnett DK, et al., 2019) is: 3.8%   Values used to calculate the score:     Age: 38 years     Clincally relevant sex: Male     Is Non-Hispanic African American: Yes     Diabetic: No     Tobacco smoker: No     Systolic Blood Pressure: 140 mmHg     Is BP treated: No     HDL Cholesterol: 41.2 mg/dL     Total Cholesterol: 194 mg/dL

## 2024-11-14 NOTE — Patient Instructions (Addendum)
 Labs today Schedule appointment with Dr Copland sports medicine for right knee pain evaluation.  Good to see you today Return as needed or in 1 year for next physical.    [email or web address]

## 2024-11-14 NOTE — Assessment & Plan Note (Addendum)
 Preventative protocols reviewed and updated unless pt declined. Discussed healthy diet and lifestyle.  Marked HLD at [place] age, strong fmhx cancers - provided with info on GeneConnect program.

## 2024-11-14 NOTE — Assessment & Plan Note (Signed)
 Chronic knee pain present for about a year, without inciting trauma/injury. This is side of remote meniscal repair.  Reassuring exam today Suggested sports med f/u vs PT - he will start with sports med for further eval.

## 2024-11-14 NOTE — Progress Notes (Signed)
 Ph: [phone] Fax: [phone]   Patient ID: Jorge Simpson, male    DOB: 02-01-1986, 38 y.o.   MRN: [id number]  This visit was conducted in person.  BP (!) 140/90   Pulse 62   Temp 97.6 F (36.4 C) (Oral)   Ht 5' 11.65 (1.82 m)   Wt 221 lb 6.4 oz (100.4 kg)   SpO2 100%   BMI 30.32 kg/m   BP Readings from Last 3 Encounters:  11/14/24 (!) 140/90  11/10/23 132/82  02/11/23 124/82   CC: CPE Subjective:   HPI: Jorge Simpson is a 38 y.o. male presenting on 11/14/2024 for Annual Exam (Fasting for labs )   Fmhx HLD - but no CAD or CVA besides paternal grandfather at age 60s.  LDL 201 off statin  R knee pain with some instability present for the past year. No locking of knee. Pain present with lateral movement at knee. Denies inciting trauma/injury or falls. H/o torn meniscus as senior in HS.  H/o L ACL repair 2009, again 2010  Preventative: Flu shot - declined COVID vaccine - Pfizer 02/2020, 03/2020, booster 10/2020  Td 2006, Tdap - 05/2017 Seat belt use discussed Sunscreen use discussed. No changing moles on skin.  Sleep - averaging 6.5 hours/night  Non smoker Alcohol - socially  Dentist q6 mo  Eye exam yearly  Bowel - no constipation    Lives with wife and step son (2014) and son (2018) Occupation: [employer] since age 23yo Edu: BS in oceanographer at [employer]  Activity: works out at gym 5x/wk  Diet: good water, fruits/vegetables daily , avoids fast food, junk food, sodas     Relevant past medical, surgical, family and social history reviewed and updated as indicated. Interim medical history since our last visit reviewed. Allergies and medications reviewed and updated. Outpatient Medications Prior to Visit  Medication Sig Dispense Refill   clobetasol  cream (TEMOVATE ) 0.05 % Apply 1 Application topically 2 (two) times daily. Apply to AA as needed, no longer than 10 days at a time 45 g 0   Multiple Vitamins-Minerals (MULTIVITAMIN & MINERAL PO) Take 1 tablet by  mouth daily.     atorvastatin  (LIPITOR) 40 MG tablet Take 1 tablet (40 mg total) by mouth daily. 90 tablet 4   No facility-administered medications prior to visit.     Per HPI unless specifically indicated in ROS section below Review of Systems  Constitutional:  Negative for activity change, appetite change, chills, fatigue, fever and unexpected weight change.  HENT:  Negative for hearing loss.   Eyes:  Negative for visual disturbance.  Respiratory:  Negative for cough, chest tightness, shortness of breath and wheezing.   Cardiovascular:  Negative for chest pain, palpitations and leg swelling.  Gastrointestinal:  Negative for abdominal distention, abdominal pain, blood in stool, constipation, diarrhea, nausea and vomiting.  Genitourinary:  Negative for difficulty urinating and hematuria.  Musculoskeletal:  Negative for arthralgias, myalgias and neck pain.  Skin:  Negative for rash.  Neurological:  Negative for dizziness, seizures, syncope and headaches.  Hematological:  Negative for adenopathy. Does not bruise/bleed easily.  Psychiatric/Behavioral:  Negative for dysphoric mood. The patient is not nervous/anxious.     Objective:  BP (!) 140/90   Pulse 62   Temp 97.6 F (36.4 C) (Oral)   Ht 5' 11.65 (1.82 m)   Wt 221 lb 6.4 oz (100.4 kg)   SpO2 100%   BMI 30.32 kg/m   Wt Readings from Last 3 Encounters:  11/14/24 221  lb 6.4 oz (100.4 kg)  11/10/23 212 lb 6 oz (96.3 kg)  02/11/23 220 lb 8 oz (100 kg)      Physical Exam Vitals and nursing note reviewed.  Constitutional:      General: He is not in acute distress.    Appearance: Normal appearance. He is well-developed. He is not ill-appearing.  HENT:     Head: Normocephalic and atraumatic.     Right Ear: Hearing, tympanic membrane, ear canal and external ear normal.     Left Ear: Hearing, tympanic membrane, ear canal and external ear normal.     Mouth/Throat:     Mouth: Mucous membranes are moist.     Pharynx: Oropharynx  is clear. No oropharyngeal exudate or posterior oropharyngeal erythema.  Eyes:     General: No scleral icterus.    Extraocular Movements: Extraocular movements intact.     Conjunctiva/sclera: Conjunctivae normal.     Pupils: Pupils are equal, round, and reactive to light.  Neck:     Thyroid : No thyroid  mass or thyromegaly.  Cardiovascular:     Rate and Rhythm: Normal rate and regular rhythm.     Pulses: Normal pulses.          Radial pulses are 2+ on the right side and 2+ on the left side.     Heart sounds: Normal heart sounds. No murmur heard. Pulmonary:     Effort: Pulmonary effort is normal. No respiratory distress.     Breath sounds: Normal breath sounds. No wheezing, rhonchi or rales.  Abdominal:     General: Bowel sounds are normal. There is no distension.     Palpations: Abdomen is soft. There is no mass.     Tenderness: There is no abdominal tenderness. There is no guarding or rebound.     Hernia: No hernia is present.  Musculoskeletal:        General: Normal range of motion.     Cervical back: Normal range of motion and neck supple.     Right lower leg: No edema.     Left lower leg: No edema.     Comments:  L knee WNL R knee exam: No deformity on inspection. No significant pain with palpation of knee landmarks. No effusion/swelling noted. FROM in flex/extension without crepitus. No popliteal fullness. Neg drawer test. Neg mcmurray test. No pain with valgus/varus stress. No PFgrind. No abnormal patellar mobility.   Lymphadenopathy:     Cervical: No cervical adenopathy.  Skin:    General: Skin is warm and dry.     Findings: No rash.  Neurological:     General: No focal deficit present.     Mental Status: He is alert and oriented to person, place, and time.  Psychiatric:        Mood and Affect: Mood normal.        Behavior: Behavior normal.        Thought Content: Thought content normal.        Judgment: Judgment normal.       Results for orders placed or  performed in visit on 11/10/23  Lipid panel   Collection Time: 11/10/23  3:24 PM  Result Value Ref Range   Cholesterol 194 0 - 200 mg/dL   Triglycerides 851.9 0.0 - 149.0 mg/dL   HDL 58.79 >60.99 mg/dL   VLDL 70.3 0.0 - 59.9 mg/dL   LDL Cholesterol 875 (H) 0 - 99 mg/dL   Total CHOL/HDL Ratio 5    NonHDL 153.28  Comprehensive metabolic panel   Collection Time: 11/10/23  3:24 PM  Result Value Ref Range   Sodium 140 135 - 145 mEq/L   Potassium 3.9 3.5 - 5.1 mEq/L   Chloride 103 96 - 112 mEq/L   CO2 29 19 - 32 mEq/L   Glucose, Bld 84 70 - 99 mg/dL   BUN 13 6 - 23 mg/dL   Creatinine, Ser 8.75 0.40 - 1.50 mg/dL   Total Bilirubin 0.6 0.2 - 1.2 mg/dL   Alkaline Phosphatase 59 39 - 117 U/L   AST 26 0 - 37 U/L   ALT 19 0 - 53 U/L   Total Protein 7.1 6.0 - 8.3 g/dL   Albumin 4.5 3.5 - 5.2 g/dL   GFR 26.74 >39.99 mL/min   Calcium  9.2 8.4 - 10.5 mg/dL    Assessment & Plan:   Problem List Items Addressed This Visit     Health maintenance examination - Primary (Chronic)   Preventative protocols reviewed and updated unless pt declined. Discussed healthy diet and lifestyle.  Marked HLD at [place] age, strong fmhx cancers - provided with info on GeneConnect program.       HLD (hyperlipidemia)   Chronic stable on atorvastatin  - continue. The 10-year ASCVD risk score (Arnett DK, et al., 2019) is: 3.8%   Values used to calculate the score:     Age: 31 years     Clincally relevant sex: Male     Is Non-Hispanic African American: Yes     Diabetic: No     Tobacco smoker: No     Systolic Blood Pressure: 140 mmHg     Is BP treated: No     HDL Cholesterol: 41.2 mg/dL     Total Cholesterol: 194 mg/dL       Relevant Medications   atorvastatin  (LIPITOR) 40 MG tablet   Other Relevant Orders   Lipid panel   Comprehensive metabolic panel with GFR   Chronic pain of right knee   Chronic knee pain present for about a year, without inciting trauma/injury. This is side of remote meniscal  repair.  Reassuring exam today Suggested sports med f/u vs PT - he will start with sports med for further eval.         Meds ordered this encounter  Medications   atorvastatin  (LIPITOR) 40 MG tablet    Sig: Take 1 tablet (40 mg total) by mouth daily.    Dispense:  90 tablet    Refill:  3    Orders Placed This Encounter  Procedures   Lipid panel   Comprehensive metabolic panel with GFR    Patient Instructions  Labs today Schedule appointment with Dr Watt sports medicine for right knee pain evaluation.  Good to see you today Return as needed or in 1 year for next physical.    [email or web address]  Follow up plan: Return in about 1 year (around 11/14/2025), or if symptoms worsen or fail to improve, for annual exam, prior fasting for blood work.  Anton Blas, MD

## 2025-06-09 ENCOUNTER — Encounter: Admitting: Family Medicine
